# Patient Record
Sex: Female | Born: 1995 | Hispanic: No | Marital: Single | Smoking: Never smoker
Health system: Southern US, Community
[De-identification: ages and names within clinical notes are randomized; demographics above are authoritative.]

## PROBLEM LIST (undated history)

## (undated) DIAGNOSIS — F419 Anxiety disorder, unspecified: Secondary | ICD-10-CM

## (undated) DIAGNOSIS — I10 Essential (primary) hypertension: Secondary | ICD-10-CM

## (undated) DIAGNOSIS — K219 Gastro-esophageal reflux disease without esophagitis: Secondary | ICD-10-CM

## (undated) DIAGNOSIS — E119 Type 2 diabetes mellitus without complications: Secondary | ICD-10-CM

## (undated) DIAGNOSIS — F32A Depression, unspecified: Secondary | ICD-10-CM

## (undated) DIAGNOSIS — E282 Polycystic ovarian syndrome: Secondary | ICD-10-CM

## (undated) HISTORY — DX: Depression, unspecified: F32.A

## (undated) HISTORY — DX: Gastro-esophageal reflux disease without esophagitis: K21.9

## (undated) HISTORY — DX: Anxiety disorder, unspecified: F41.9

## (undated) HISTORY — PX: TONSILLECTOMY: SUR1361

## (undated) HISTORY — DX: Type 2 diabetes mellitus without complications: E11.9

---
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
## 2017-08-29 ENCOUNTER — Telehealth: Payer: Self-pay | Admitting: Internal Medicine

## 2017-08-29 ENCOUNTER — Encounter: Payer: Self-pay | Admitting: Emergency Medicine

## 2017-08-29 ENCOUNTER — Ambulatory Visit: Payer: BC Managed Care – PPO | Admitting: Emergency Medicine

## 2017-08-29 VITALS — BP 135/84 | HR 94 | Temp 98.5°F | Resp 17 | Ht 66.0 in | Wt 333.0 lb

## 2017-08-29 DIAGNOSIS — T24202A Burn of second degree of unspecified site of left lower limb, except ankle and foot, initial encounter: Secondary | ICD-10-CM

## 2017-08-29 MED ORDER — SILVER SULFADIAZINE 1 % EX CREA: 1.0000 "application " | TOPICAL_CREAM | Freq: Every day | CUTANEOUS | 0 refills | Status: AC

## 2017-08-29 MED ORDER — SILVER SULFADIAZINE 1 % EX CREA
TOPICAL_CREAM | Freq: Once | CUTANEOUS | Status: AC
  Administered 2017-08-29: 17:00:00 via TOPICAL

## 2017-08-29 MED ORDER — CEPHALEXIN 500 MG PO CAPS: 500.0000 mg | ORAL_CAPSULE | Freq: Three times a day (TID) | ORAL | 0 refills | Status: AC

## 2017-08-29 NOTE — Telephone Encounter (Signed)
Appointment scheduled.

## 2017-08-29 NOTE — Telephone Encounter (Signed)
Yes, I will see her 

## 2017-08-29 NOTE — Telephone Encounter (Signed)
Copied from CRM [phone]. Topic: Appointment Scheduling - Scheduling Inquiry for Clinic >> Aug 28, 2017  6:20 PM Stephannie LiSimmons, Janett L, [place] wrote: Reason for CRM: Patients Mom and Dad are patients of Dr Yetta BarreJones and they would like for him to become their daughters pcp the parents are Morton AmySandra Oyola and the dad Merlene MorseFrancis Cephus please call the mom art [phone] to let her know and to schedule an appointment .
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
## 2017-08-29 NOTE — Telephone Encounter (Signed)
Dr Yetta BarreJones, would you be willing to see this patient to establish care? Her mother [name](Sandra Oyola) and father Merlene Morse(Francis Telleria) are patients of yours.

## 2017-08-29 NOTE — Patient Instructions (Addendum)
   IF you received an x-ray today, you will receive an invoice from [facility]. Please contact [facility] at [phone] with questions or concerns regarding your invoice.   IF you received labwork today, you will receive an invoice from [facility]. Please contact [facility] at [phone] with questions or concerns regarding your invoice.   Our billing staff will not be able to assist you with questions regarding bills from these companies.  You will be contacted with the lab results as soon as they are available. The fastest way to get your results is to activate your My Chart account. Instructions are located on the last page of this paperwork. If you have not heard from us regarding the results in 2 weeks, please contact this office.     Burn Care, Adult A burn is an injury to the skin or the tissues under the skin. There are three types of burns:  First degree. These burns may cause the skin to be red and a bit swollen.  Second degree. These burns are very painful and cause the skin to be very red. The skin may also leak fluid, look shiny, and start to have blisters.  Third degree. These burns cause permanent damage. They turn the skin white or black and make it look charred, dry, and leathery.  Taking care of your burn properly can help to prevent pain and infection. It can also help the burn to heal more quickly. How is this treated? Right after a burn:  Rinse or soak the burn under cool water. Do this for several minutes. Do not put ice on your burn. That can cause more damage.  Lightly cover the burn with a clean (sterile) cloth (dressing). Burn care  Raise (elevate) the injured area above the level of your heart while sitting or lying down.  Follow instructions from your doctor about: ? How to clean and take care of the burn. ? When to change and remove the cloth.  Check your burn every day for signs of infection. Check for: ? More redness,  swelling, or pain. ? Warmth. ? Pus or a bad smell. Medicine   Take over-the-counter and prescription medicines only as told by your doctor.  If you were prescribed antibiotic medicine, take or apply it as told by your doctor. Do not stop using the antibiotic even if your condition improves. General instructions  To prevent infection: ? Do not put butter, oil, or other home treatments on the burn. ? Do not scratch or pick at the burn. ? Do not break any blisters. ? Do not peel skin.  Do not rub your burn, even when you are cleaning it.  Protect your burn from the sun. Contact a doctor if:  Your condition does not get better.  Your condition gets worse.  You have a fever.  Your burn looks different or starts to have black or red spots on it.  Your burn feels warm to the touch.  Your pain is not controlled with medicine. Get help right away if:  You have redness, swelling, or pain at the site of the burn.  You have fluid, blood, or pus coming from your burn.  You have red streaks near the burn.  You have very bad pain. This information is not intended to replace advice given to you by your health care provider. Make sure you discuss any questions you have with your health care provider. Document Released: 03/22/2008 Document Revised: 07/30/2016 Document Reviewed: 12/01/2015 Elsevier Interactive   Patient Education  2018 Elsevier Inc.  

## 2017-09-05 ENCOUNTER — Encounter: Payer: Self-pay | Admitting: Emergency Medicine

## 2017-09-05 ENCOUNTER — Ambulatory Visit: Payer: BC Managed Care – PPO | Admitting: Emergency Medicine

## 2017-09-05 ENCOUNTER — Other Ambulatory Visit: Payer: Self-pay

## 2017-09-05 VITALS — BP 135/84 | HR 98 | Temp 99.0°F | Resp 16 | Wt 333.2 lb

## 2017-09-05 DIAGNOSIS — T24202D Burn of second degree of unspecified site of left lower limb, except ankle and foot, subsequent encounter: Secondary | ICD-10-CM | POA: Diagnosis not present
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
## 2017-09-05 NOTE — Assessment & Plan Note (Signed)
Much improved.  No signs of infection.  Compliant with medications.  No change in management.  Continue same care.  Follow-up in 1 week.

## 2017-09-05 NOTE — Patient Instructions (Addendum)
   IF you received an x-ray today, you will receive an invoice from [facility]. Please contact [facility] at [phone] with questions or concerns regarding your invoice.   IF you received labwork today, you will receive an invoice from [facility]. Please contact [facility] at [phone] with questions or concerns regarding your invoice.   Our billing staff will not be able to assist you with questions regarding bills from these companies.  You will be contacted with the lab results as soon as they are available. The fastest way to get your results is to activate your My Chart account. Instructions are located on the last page of this paperwork. If you have not heard from us regarding the results in 2 weeks, please contact this office.     Burn Care, Adult A burn is an injury to the skin or the tissues under the skin. There are three types of burns:  First degree. These burns may cause the skin to be red and a bit swollen.  Second degree. These burns are very painful and cause the skin to be very red. The skin may also leak fluid, look shiny, and start to have blisters.  Third degree. These burns cause permanent damage. They turn the skin white or black and make it look charred, dry, and leathery.  Taking care of your burn properly can help to prevent pain and infection. It can also help the burn to heal more quickly. How is this treated? Right after a burn:  Rinse or soak the burn under cool water. Do this for several minutes. Do not put ice on your burn. That can cause more damage.  Lightly cover the burn with a clean (sterile) cloth (dressing). Burn care  Raise (elevate) the injured area above the level of your heart while sitting or lying down.  Follow instructions from your doctor about: ? How to clean and take care of the burn. ? When to change and remove the cloth.  Check your burn every day for signs of infection. Check for: ? More redness,  swelling, or pain. ? Warmth. ? Pus or a bad smell. Medicine   Take over-the-counter and prescription medicines only as told by your doctor.  If you were prescribed antibiotic medicine, take or apply it as told by your doctor. Do not stop using the antibiotic even if your condition improves. General instructions  To prevent infection: ? Do not put butter, oil, or other home treatments on the burn. ? Do not scratch or pick at the burn. ? Do not break any blisters. ? Do not peel skin.  Do not rub your burn, even when you are cleaning it.  Protect your burn from the sun. Contact a doctor if:  Your condition does not get better.  Your condition gets worse.  You have a fever.  Your burn looks different or starts to have black or red spots on it.  Your burn feels warm to the touch.  Your pain is not controlled with medicine. Get help right away if:  You have redness, swelling, or pain at the site of the burn.  You have fluid, blood, or pus coming from your burn.  You have red streaks near the burn.  You have very bad pain. This information is not intended to replace advice given to you by your health care provider. Make sure you discuss any questions you have with your health care provider. Document Released: 03/22/2008 Document Revised: 07/30/2016 Document Reviewed: 12/01/2015 Elsevier Interactive   Patient Education  2018 Elsevier Inc.  

## 2017-09-12 ENCOUNTER — Ambulatory Visit: Payer: BC Managed Care – PPO | Admitting: Emergency Medicine

## 2017-09-12 ENCOUNTER — Encounter: Payer: Self-pay | Admitting: Emergency Medicine

## 2017-09-12 ENCOUNTER — Encounter: Payer: Self-pay | Admitting: Internal Medicine

## 2017-09-12 VITALS — BP 144/82 | HR 119 | Temp 98.8°F | Resp 17 | Ht 66.0 in | Wt 338.0 lb

## 2017-09-12 DIAGNOSIS — T24202D Burn of second degree of unspecified site of left lower limb, except ankle and foot, subsequent encounter: Secondary | ICD-10-CM
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
## 2017-09-12 NOTE — Patient Instructions (Addendum)
   IF you received an x-ray today, you will receive an invoice from [facility]. Please contact [facility] at [phone] with questions or concerns regarding your invoice.   IF you received labwork today, you will receive an invoice from [facility]. Please contact [facility] at [phone] with questions or concerns regarding your invoice.   Our billing staff will not be able to assist you with questions regarding bills from these companies.  You will be contacted with the lab results as soon as they are available. The fastest way to get your results is to activate your My Chart account. Instructions are located on the last page of this paperwork. If you have not heard from us regarding the results in 2 weeks, please contact this office.     Burn Care, Adult A burn is an injury to the skin or the tissues under the skin. There are three types of burns:  First degree. These burns may cause the skin to be red and a bit swollen.  Second degree. These burns are very painful and cause the skin to be very red. The skin may also leak fluid, look shiny, and start to have blisters.  Third degree. These burns cause permanent damage. They turn the skin white or black and make it look charred, dry, and leathery.  Taking care of your burn properly can help to prevent pain and infection. It can also help the burn to heal more quickly. How is this treated? Right after a burn:  Rinse or soak the burn under cool water. Do this for several minutes. Do not put ice on your burn. That can cause more damage.  Lightly cover the burn with a clean (sterile) cloth (dressing). Burn care  Raise (elevate) the injured area above the level of your heart while sitting or lying down.  Follow instructions from your doctor about: ? How to clean and take care of the burn. ? When to change and remove the cloth.  Check your burn every day for signs of infection. Check for: ? More redness,  swelling, or pain. ? Warmth. ? Pus or a bad smell. Medicine   Take over-the-counter and prescription medicines only as told by your doctor.  If you were prescribed antibiotic medicine, take or apply it as told by your doctor. Do not stop using the antibiotic even if your condition improves. General instructions  To prevent infection: ? Do not put butter, oil, or other home treatments on the burn. ? Do not scratch or pick at the burn. ? Do not break any blisters. ? Do not peel skin.  Do not rub your burn, even when you are cleaning it.  Protect your burn from the sun. Contact a doctor if:  Your condition does not get better.  Your condition gets worse.  You have a fever.  Your burn looks different or starts to have black or red spots on it.  Your burn feels warm to the touch.  Your pain is not controlled with medicine. Get help right away if:  You have redness, swelling, or pain at the site of the burn.  You have fluid, blood, or pus coming from your burn.  You have red streaks near the burn.  You have very bad pain. This information is not intended to replace advice given to you by your health care provider. Make sure you discuss any questions you have with your health care provider. Document Released: 03/22/2008 Document Revised: 07/30/2016 Document Reviewed: 12/01/2015 Elsevier Interactive   Patient Education  2018 Elsevier Inc.  

## 2017-09-13 ENCOUNTER — Ambulatory Visit: Payer: BC Managed Care – PPO | Admitting: Internal Medicine

## 2017-09-27 ENCOUNTER — Ambulatory Visit: Payer: BC Managed Care – PPO | Admitting: Internal Medicine

## 2017-10-18 ENCOUNTER — Ambulatory Visit: Payer: BC Managed Care – PPO | Admitting: Internal Medicine

## 2017-10-18 ENCOUNTER — Encounter: Payer: Self-pay | Admitting: Internal Medicine

## 2017-10-18 ENCOUNTER — Other Ambulatory Visit (INDEPENDENT_AMBULATORY_CARE_PROVIDER_SITE_OTHER): Payer: BC Managed Care – PPO

## 2017-10-18 VITALS — BP 124/84 | HR 116 | Temp 99.5°F | Ht 66.0 in | Wt 344.0 lb

## 2017-10-18 DIAGNOSIS — Z0000 Encounter for general adult medical examination without abnormal findings: Secondary | ICD-10-CM | POA: Diagnosis not present

## 2017-10-18 DIAGNOSIS — Z114 Encounter for screening for human immunodeficiency virus [HIV]: Secondary | ICD-10-CM

## 2017-10-18 DIAGNOSIS — E282 Polycystic ovarian syndrome: Secondary | ICD-10-CM

## 2017-10-18 LAB — CBC WITH DIFFERENTIAL/PLATELET
BASOS ABS: 0.1 10*3/uL (ref 0.0–0.1)
Basophils Relative: 0.7 % (ref 0.0–3.0)
Eosinophils Absolute: 0.2 10*3/uL (ref 0.0–0.7)
Eosinophils Relative: 1.2 % (ref 0.0–5.0)
HCT: 40.9 % (ref 36.0–49.0)
Hemoglobin: 13.6 g/dL (ref 12.0–16.0)
LYMPHS ABS: 3.3 10*3/uL (ref 0.7–4.0)
Lymphocytes Relative: 23.8 % — ABNORMAL LOW (ref 24.0–48.0)
MCHC: 33.3 g/dL (ref 31.0–37.0)
MCV: 83.3 fl (ref 78.0–98.0)
MONO ABS: 1.1 10*3/uL — AB (ref 0.1–1.0)
Monocytes Relative: 8.2 % (ref 3.0–12.0)
NEUTROS ABS: 9.2 10*3/uL — AB (ref 1.4–7.7)
NEUTROS PCT: 66.1 % (ref 43.0–71.0)
RBC: 4.9 Mil/uL (ref 3.80–5.70)
RDW: 13.6 % (ref 11.4–15.5)
WBC: 13.8 10*3/uL — ABNORMAL HIGH (ref 4.5–13.5)

## 2017-10-18 LAB — BASIC METABOLIC PANEL
BUN: 9 mg/dL (ref 6–23)
CALCIUM: 9.7 mg/dL (ref 8.4–10.5)
CHLORIDE: 105 meq/L (ref 96–112)
CO2: 28 meq/L (ref 19–32)
CREATININE: 0.77 mg/dL (ref 0.40–1.20)
GFR: 102.03 mL/min (ref >60.00)
GLUCOSE: 87 mg/dL (ref 70–99)
Potassium: 4.4 mEq/L (ref 3.5–5.1)
SODIUM: 141 meq/L (ref 135–145)

## 2017-10-18 LAB — HEPATIC FUNCTION PANEL
ALBUMIN: 4.2 g/dL (ref 3.5–5.2)
ALK PHOS: 75 U/L (ref 47–119)
ALT: 21 U/L (ref 0–35)
AST: 20 U/L (ref 0–37)
BILIRUBIN TOTAL: 0.3 mg/dL (ref 0.2–1.2)
Bilirubin, Direct: 0 mg/dL (ref 0.0–0.3)
Total Protein: 8 g/dL (ref 6.0–8.3)

## 2017-10-18 LAB — URINALYSIS, ROUTINE W REFLEX MICROSCOPIC
Bilirubin Urine: NEGATIVE
HGB URINE DIPSTICK: NEGATIVE
LEUKOCYTES UA: NEGATIVE
Nitrite: NEGATIVE
RBC / HPF: NONE SEEN (ref 0–?)
Specific Gravity, Urine: >=1.03 — AB (ref 1.000–1.030)
TOTAL PROTEIN, URINE-UPE24: NEGATIVE
URINE GLUCOSE: NEGATIVE
Urobilinogen, UA: 0.2 (ref 0.0–1.0)
pH: 6 (ref 5.0–8.0)

## 2017-10-18 LAB — LIPID PANEL
CHOL/HDL RATIO: 4
Cholesterol: 177 mg/dL (ref 0–200)
HDL: 49.1 mg/dL (ref >39.00)
LDL CALC: 106 mg/dL — AB (ref 0–99)
NONHDL: 128.05
TRIGLYCERIDES: 108 mg/dL (ref 0.0–149.0)
VLDL: 21.6 mg/dL (ref 0.0–40.0)

## 2017-10-18 LAB — TSH: TSH: 1.54 u[IU]/mL (ref 0.40–5.00)

## 2017-10-18 NOTE — Assessment & Plan Note (Signed)
Ok for GYN and endo referrals,  to f/u any worsening symptoms or concerns

## 2017-10-18 NOTE — Assessment & Plan Note (Signed)
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
## 2017-10-18 NOTE — Patient Instructions (Signed)
Please continue all other medications as before, and refills have been done if requested.  Please have the pharmacy call with any other refills you may need.  Please continue your efforts at being more active, low cholesterol diet, and weight control.  You are otherwise up to date with prevention measures today.  Please keep your appointments with your specialists as you may have planned  You will be contacted regarding the referral for: GYN and Endocrinology  Please go to the LAB in the Basement (turn left off the elevator) for the tests to be done today  You will be contacted by phone if any changes need to be made immediately.  Otherwise, you will receive a letter about your results with an explanation, but please check with MyChart first.  Please remember to sign up for MyChart if you have not done so, as this will be important to you in the future with finding out test results, communicating by private email, and scheduling acute appointments online when needed.  Please return in 1 year for your yearly visit, or sooner if needed, with Lab testing done 3-5 days before

## 2017-10-19 LAB — HIV ANTIBODY (ROUTINE TESTING W REFLEX): HIV: NONREACTIVE

## 2017-12-25 ENCOUNTER — Encounter: Payer: Self-pay | Admitting: Internal Medicine

## 2018-01-04 ENCOUNTER — Encounter (HOSPITAL_COMMUNITY): Payer: Self-pay | Admitting: Emergency Medicine

## 2018-01-04 ENCOUNTER — Ambulatory Visit: Payer: Self-pay | Admitting: *Deleted

## 2018-01-04 ENCOUNTER — Ambulatory Visit (HOSPITAL_COMMUNITY)
Admission: EM | Admit: 2018-01-04 | Discharge: 2018-01-04 | Disposition: A | Discharge status: Home / Self Care | Payer: BC Managed Care – PPO | Attending: Family Medicine | Admitting: Family Medicine

## 2018-01-04 DIAGNOSIS — R0789 Other chest pain: Secondary | ICD-10-CM

## 2018-01-04 DIAGNOSIS — R030 Elevated blood-pressure reading, without diagnosis of hypertension: Secondary | ICD-10-CM

## 2018-01-04 DIAGNOSIS — R079 Chest pain, unspecified: Secondary | ICD-10-CM

## 2018-01-04 MED ORDER — GI COCKTAIL ~~LOC~~
ORAL | Status: AC
  Filled 2018-01-04: qty 30

## 2018-01-04 MED ORDER — GI COCKTAIL ~~LOC~~
30.0000 mL | Freq: Once | ORAL | Status: AC
Start: 2018-01-04 — End: 2018-01-04
  Administered 2018-01-04: 30 mL via ORAL

## 2018-01-04 MED ORDER — ACETAMINOPHEN 500 MG PO TABS: 500.0000 mg | ORAL_TABLET | Freq: Four times a day (QID) | ORAL | 0 refills | Status: DC | PRN

## 2018-01-04 MED ORDER — OMEPRAZOLE 20 MG PO CPDR: 20.0000 mg | DELAYED_RELEASE_CAPSULE | Freq: Every day | ORAL | 0 refills | Status: DC

## 2018-01-04 NOTE — Telephone Encounter (Signed)
Pt reports at dentist office for cleaning; BP 160/117. Rechecked x 2...159/110 and 157/105. Wrist monitor used. No medications. Reports "History of high BP in 2016."  Reports mild headache 3/10, chest "A little tight." Pt is presently at a [employer]. Instructed pt to recheck BP; NT will call back. 1305:  NT called pt back. States BP 169/123   HR 107.  Chest tightness presently.Directed pt to ED. States will follow disposition, mother to drive.  Reason for Disposition . [1] Systolic BP  >= 180 OR Diastolic >= 110 AND [2] missed most recent dose of blood pressure medication    Not on medication. B/P checked at dentist office; wrist monitor.  Answer Assessment - Initial Assessment Questions 1. BLOOD PRESSURE: "What is the blood pressure?" "Did you take at least two measurements 5 minutes apart?"     At dentist: 160/117     159/110       157/105 2. ONSET: "When did you take your blood pressure?"     This afternoon, 15 minutes ago 3. HOW: "How did you obtain the blood pressure?" (e.g., visiting nurse, automatic home BP monitor)     Wrist, at dentist 4. HISTORY: "Do you have a history of high blood pressure?"     States yes, 2016 5. MEDICATIONS: "Are you taking any medications for blood pressure?" "Have you missed any doses recently?"     n/a 6. OTHER SYMPTOMS: "Do you have any symptoms?" (e.g., headache, chest pain, blurred vision, difficulty breathing, weakness)    Headache, 3/10 ...chest "A little tight"  Protocols used: HIGH BLOOD PRESSURE-A-AH
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
## 2018-01-04 NOTE — Discharge Instructions (Addendum)
Omeprazole daily. Tylenol as needed for headache.  Please follow up with your primary care doctor for recheck of your blood pressure and symptoms. If you develop worsening of pain, fevers, shortness of breath , nausea, or otherwise worsening please return or go to Er.

## 2018-01-04 NOTE — ED Triage Notes (Signed)
Pt states she was at the dentist and they took her BP and it was elevated, 150/110.

## 2018-01-06 ENCOUNTER — Encounter: Payer: Self-pay | Admitting: Internal Medicine

## 2018-01-10 ENCOUNTER — Ambulatory Visit: Payer: BC Managed Care – PPO | Admitting: Family

## 2018-01-10 ENCOUNTER — Ambulatory Visit (INDEPENDENT_AMBULATORY_CARE_PROVIDER_SITE_OTHER)
Admission: RE | Admit: 2018-01-10 | Discharge: 2018-01-10 | Disposition: A | Discharge status: Home / Self Care | Payer: BC Managed Care – PPO | Source: Ambulatory Visit | Attending: Family | Admitting: Family

## 2018-01-10 ENCOUNTER — Encounter: Payer: Self-pay | Admitting: Family

## 2018-01-10 ENCOUNTER — Other Ambulatory Visit (INDEPENDENT_AMBULATORY_CARE_PROVIDER_SITE_OTHER): Payer: BC Managed Care – PPO

## 2018-01-10 VITALS — BP 158/88 | HR 98 | Temp 97.9°F | Ht 66.01 in | Wt 357.2 lb

## 2018-01-10 DIAGNOSIS — R109 Unspecified abdominal pain: Secondary | ICD-10-CM | POA: Diagnosis not present

## 2018-01-10 DIAGNOSIS — R600 Localized edema: Secondary | ICD-10-CM | POA: Diagnosis not present

## 2018-01-10 DIAGNOSIS — I10 Essential (primary) hypertension: Secondary | ICD-10-CM | POA: Diagnosis not present

## 2018-01-10 LAB — CBC WITH DIFFERENTIAL/PLATELET
BASOS PCT: 0.3 % (ref 0.0–3.0)
Basophils Absolute: 0 10*3/uL (ref 0.0–0.1)
EOS PCT: 6.8 % — AB (ref 0.0–5.0)
Eosinophils Absolute: 0.7 10*3/uL (ref 0.0–0.7)
HCT: 38.7 % (ref 36.0–49.0)
Hemoglobin: 12.8 g/dL (ref 12.0–16.0)
Lymphocytes Relative: 27.9 % (ref 24.0–48.0)
Lymphs Abs: 2.8 10*3/uL (ref 0.7–4.0)
MCHC: 33.1 g/dL (ref 31.0–37.0)
MCV: 82.5 fl (ref 78.0–98.0)
MONO ABS: 0.9 10*3/uL (ref 0.1–1.0)
Monocytes Relative: 8.8 % (ref 3.0–12.0)
NEUTROS PCT: 56.2 % (ref 43.0–71.0)
Neutro Abs: 5.7 10*3/uL (ref 1.4–7.7)
PLATELETS: 385 10*3/uL (ref 150.0–575.0)
RBC: 4.69 Mil/uL (ref 3.80–5.70)
RDW: 14.3 % (ref 11.4–15.5)
WBC: 10.1 10*3/uL (ref 4.5–13.5)

## 2018-01-10 LAB — COMPREHENSIVE METABOLIC PANEL
ALK PHOS: 66 U/L (ref 47–119)
ALT: 20 U/L (ref 0–35)
AST: 12 U/L (ref 0–37)
Albumin: 4.1 g/dL (ref 3.5–5.2)
BUN: 12 mg/dL (ref 6–23)
CO2: 30 mEq/L (ref 19–32)
CREATININE: 0.74 mg/dL (ref 0.40–1.20)
Calcium: 9.6 mg/dL (ref 8.4–10.5)
Chloride: 103 mEq/L (ref 96–112)
GFR: 106.57 mL/min (ref >60.00)
Glucose, Bld: 97 mg/dL (ref 70–99)
POTASSIUM: 4.4 meq/L (ref 3.5–5.1)
Sodium: 140 mEq/L (ref 135–145)
TOTAL PROTEIN: 7.9 g/dL (ref 6.0–8.3)
Total Bilirubin: 0.2 mg/dL (ref 0.2–1.2)

## 2018-01-10 LAB — URINALYSIS
Bilirubin Urine: NEGATIVE
HGB URINE DIPSTICK: NEGATIVE
Ketones, ur: NEGATIVE
Leukocytes, UA: NEGATIVE
NITRITE: NEGATIVE
Specific Gravity, Urine: >=1.03 — AB (ref 1.000–1.030)
Total Protein, Urine: NEGATIVE
URINE GLUCOSE: NEGATIVE
Urobilinogen, UA: 0.2 (ref 0.0–1.0)
pH: 5.5 (ref 5.0–8.0)

## 2018-01-10 LAB — BRAIN NATRIURETIC PEPTIDE: Pro B Natriuretic peptide (BNP): 12 pg/mL (ref 0.0–100.0)

## 2018-01-10 MED ORDER — HYDROCHLOROTHIAZIDE 25 MG PO TABS: 25.0000 mg | ORAL_TABLET | Freq: Every day | ORAL | 1 refills | Status: DC
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
## 2018-01-10 NOTE — Patient Instructions (Signed)
DASH Eating Plan DASH stands for "Dietary Approaches to Stop Hypertension." The DASH eating plan is a healthy eating plan that has been shown to reduce high blood pressure (hypertension). It may also reduce your risk for type 2 diabetes, heart disease, and stroke. The DASH eating plan may also help with weight loss. What are tips for following this plan? General guidelines  Avoid eating more than 2,300 mg (milligrams) of salt (sodium) a day. If you have hypertension, you may need to reduce your sodium intake to 1,500 mg a day.  Limit alcohol intake to no more than 1 drink a day for nonpregnant women and 2 drinks a day for men. One drink equals 12 oz of beer, 5 oz of wine, or 1 oz of hard liquor.  Work with your health care provider to maintain a healthy body weight or to lose weight. Ask what an ideal weight is for you.  Get at least 30 minutes of exercise that causes your heart to beat faster (aerobic exercise) most days of the week. Activities may include walking, swimming, or biking.  Work with your health care provider or diet and nutrition specialist (dietitian) to adjust your eating plan to your individual calorie needs. Reading food labels  Check food labels for the amount of sodium per serving. Choose foods with less than 5 percent of the Daily Value of sodium. Generally, foods with less than 300 mg of sodium per serving fit into this eating plan.  To find whole grains, look for the word "whole" as the first word in the ingredient list. Shopping  Buy products labeled as "low-sodium" or "no salt added."  Buy fresh foods. Avoid canned foods and premade or frozen meals. Cooking  Avoid adding salt when cooking. Use salt-free seasonings or herbs instead of table salt or sea salt. Check with your health care provider or pharmacist before using salt substitutes.  Do not fry foods. Cook foods using healthy methods such as baking, boiling, grilling, and broiling instead.  Cook with  heart-healthy oils, such as olive, canola, soybean, or sunflower oil. Meal planning   Eat a balanced diet that includes: ? 5 or more servings of fruits and vegetables each day. At each meal, try to fill half of your plate with fruits and vegetables. ? Up to 6-8 servings of whole grains each day. ? Less than 6 oz of lean meat, poultry, or fish each day. A 3-oz serving of meat is about the same size as a deck of cards. One egg equals 1 oz. ? 2 servings of low-fat dairy each day. ? A serving of nuts, seeds, or beans 5 times each week. ? Heart-healthy fats. Healthy fats called Omega-3 fatty acids are found in foods such as flaxseeds and coldwater fish, like sardines, salmon, and mackerel.  Limit how much you eat of the following: ? Canned or prepackaged foods. ? Food that is high in trans fat, such as fried foods. ? Food that is high in saturated fat, such as fatty meat. ? Sweets, desserts, sugary drinks, and other foods with added sugar. ? Full-fat dairy products.  Do not salt foods before eating.  Try to eat at least 2 vegetarian meals each week.  Eat more home-cooked food and less restaurant, buffet, and fast food.  When eating at a restaurant, ask that your food be prepared with less salt or no salt, if possible. What foods are recommended? The items listed may not be a complete list. Talk with your dietitian about what   dietary choices are best for you. Grains Whole-grain or whole-wheat bread. Whole-grain or whole-wheat pasta. Brown rice. Oatmeal. Quinoa. Bulgur. Whole-grain and low-sodium cereals. Pita bread. Low-fat, low-sodium crackers. Whole-wheat flour tortillas. Vegetables Fresh or frozen vegetables (raw, steamed, roasted, or grilled). Low-sodium or reduced-sodium tomato and vegetable juice. Low-sodium or reduced-sodium tomato sauce and tomato paste. Low-sodium or reduced-sodium canned vegetables. Fruits All fresh, dried, or frozen fruit. Canned fruit in natural juice (without  added sugar). Meat and other protein foods Skinless chicken or turkey. Ground chicken or turkey. Pork with fat trimmed off. Fish and seafood. Egg whites. Dried beans, peas, or lentils. Unsalted nuts, nut butters, and seeds. Unsalted canned beans. Lean cuts of beef with fat trimmed off. Low-sodium, lean deli meat. Dairy Low-fat (1%) or fat-free (skim) milk. Fat-free, low-fat, or reduced-fat cheeses. Nonfat, low-sodium ricotta or cottage cheese. Low-fat or nonfat yogurt. Low-fat, low-sodium cheese. Fats and oils Soft margarine without trans fats. Vegetable oil. Low-fat, reduced-fat, or light mayonnaise and salad dressings (reduced-sodium). Canola, safflower, olive, soybean, and sunflower oils. Avocado. Seasoning and other foods Herbs. Spices. Seasoning mixes without salt. Unsalted popcorn and pretzels. Fat-free sweets. What foods are not recommended? The items listed may not be a complete list. Talk with your dietitian about what dietary choices are best for you. Grains Baked goods made with fat, such as croissants, muffins, or some breads. Dry pasta or rice meal packs. Vegetables Creamed or fried vegetables. Vegetables in a cheese sauce. Regular canned vegetables (not low-sodium or reduced-sodium). Regular canned tomato sauce and paste (not low-sodium or reduced-sodium). Regular tomato and vegetable juice (not low-sodium or reduced-sodium). Pickles. Olives. Fruits Canned fruit in a light or heavy syrup. Fried fruit. Fruit in cream or butter sauce. Meat and other protein foods Fatty cuts of meat. Ribs. Fried meat. Bacon. Sausage. Bologna and other processed lunch meats. Salami. Fatback. Hotdogs. Bratwurst. Salted nuts and seeds. Canned beans with added salt. Canned or smoked fish. Whole eggs or egg yolks. Chicken or turkey with skin. Dairy Whole or 2% milk, cream, and half-and-half. Whole or full-fat cream cheese. Whole-fat or sweetened yogurt. Full-fat cheese. Nondairy creamers. Whipped toppings.  Processed cheese and cheese spreads. Fats and oils Butter. Stick margarine. Lard. Shortening. Ghee. Bacon fat. Tropical oils, such as coconut, palm kernel, or palm oil. Seasoning and other foods Salted popcorn and pretzels. Onion salt, garlic salt, seasoned salt, table salt, and sea salt. Worcestershire sauce. Tartar sauce. Barbecue sauce. Teriyaki sauce. Soy sauce, including reduced-sodium. Steak sauce. Canned and packaged gravies. Fish sauce. Oyster sauce. Cocktail sauce. Horseradish that you find on the shelf. Ketchup. Mustard. Meat flavorings and tenderizers. Bouillon cubes. Hot sauce and Tabasco sauce. Premade or packaged marinades. Premade or packaged taco seasonings. Relishes. Regular salad dressings. Where to find more information:  National Heart, Lung, and Blood Institute: www.nhlbi.nih.gov  American Heart Association: www.heart.org Summary  The DASH eating plan is a healthy eating plan that has been shown to reduce high blood pressure (hypertension). It may also reduce your risk for type 2 diabetes, heart disease, and stroke.  With the DASH eating plan, you should limit salt (sodium) intake to 2,300 mg a day. If you have hypertension, you may need to reduce your sodium intake to 1,500 mg a day.  When on the DASH eating plan, aim to eat more fresh fruits and vegetables, whole grains, lean proteins, low-fat dairy, and heart-healthy fats.  Work with your health care provider or diet and nutrition specialist (dietitian) to adjust your eating plan to your individual   calorie needs. This information is not intended to replace advice given to you by your health care provider. Make sure you discuss any questions you have with your health care provider. Document Released: 06/02/2011 Document Revised: 06/06/2016 Document Reviewed: 06/06/2016 Elsevier Interactive Patient Education  2018 [employer] Inc.  

## 2018-01-17 ENCOUNTER — Ambulatory Visit (HOSPITAL_COMMUNITY)
Admission: RE | Admit: 2018-01-17 | Discharge: 2018-01-17 | Disposition: A | Discharge status: Home / Self Care | Payer: BC Managed Care – PPO | Source: Ambulatory Visit | Attending: Cardiology | Admitting: Cardiology

## 2018-01-17 DIAGNOSIS — I10 Essential (primary) hypertension: Secondary | ICD-10-CM

## 2018-01-19 ENCOUNTER — Encounter: Payer: Self-pay | Admitting: Family

## 2018-01-19 ENCOUNTER — Ambulatory Visit: Payer: BC Managed Care – PPO | Admitting: Family

## 2018-01-19 ENCOUNTER — Ambulatory Visit: Payer: BC Managed Care – PPO | Admitting: Internal Medicine

## 2018-01-19 ENCOUNTER — Ambulatory Visit (INDEPENDENT_AMBULATORY_CARE_PROVIDER_SITE_OTHER)
Admission: RE | Admit: 2018-01-19 | Discharge: 2018-01-19 | Disposition: A | Discharge status: Home / Self Care | Payer: BC Managed Care – PPO | Source: Ambulatory Visit | Attending: Family | Admitting: Family

## 2018-01-19 VITALS — BP 138/90 | HR 97 | Temp 98.6°F | Ht 66.01 in | Wt 348.0 lb

## 2018-01-19 DIAGNOSIS — M25562 Pain in left knee: Secondary | ICD-10-CM

## 2018-01-19 DIAGNOSIS — I10 Essential (primary) hypertension: Secondary | ICD-10-CM | POA: Diagnosis not present

## 2018-01-19 MED ORDER — HYDROCHLOROTHIAZIDE 25 MG PO TABS: 25.0000 mg | ORAL_TABLET | Freq: Every day | ORAL | 0 refills | Status: DC

## 2018-01-19 MED ORDER — LISINOPRIL 10 MG PO TABS: 10.0000 mg | ORAL_TABLET | Freq: Every day | ORAL | 0 refills | Status: DC
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
## 2018-01-19 NOTE — Telephone Encounter (Signed)
(  FYI) Was their anything else you would advise or stick with what regiment she has now?

## 2018-01-22 ENCOUNTER — Encounter: Payer: Self-pay | Admitting: Family

## 2018-02-05 ENCOUNTER — Ambulatory Visit: Payer: BC Managed Care – PPO | Admitting: Endocrinology

## 2018-06-11 ENCOUNTER — Encounter: Payer: Self-pay | Admitting: Family

## 2018-06-11 ENCOUNTER — Ambulatory Visit: Payer: BC Managed Care – PPO | Admitting: Family

## 2018-06-11 VITALS — BP 130/86 | HR 111 | Temp 98.7°F | Resp 18 | Wt 364.0 lb

## 2018-06-11 DIAGNOSIS — J111 Influenza due to unidentified influenza virus with other respiratory manifestations: Secondary | ICD-10-CM | POA: Diagnosis not present

## 2018-06-11 DIAGNOSIS — R52 Pain, unspecified: Secondary | ICD-10-CM | POA: Diagnosis not present

## 2018-06-11 LAB — POC INFLUENZA A&B (BINAX/QUICKVUE)
INFLUENZA B, POC: POSITIVE — AB
Influenza A, POC: NEGATIVE

## 2018-06-11 MED ORDER — OSELTAMIVIR PHOSPHATE 75 MG PO CAPS: 75.0000 mg | ORAL_CAPSULE | Freq: Two times a day (BID) | ORAL | 0 refills | Status: DC

## 2018-06-11 MED ORDER — HYDROCHLOROTHIAZIDE 25 MG PO TABS: 25.0000 mg | ORAL_TABLET | Freq: Every day | ORAL | 0 refills | Status: DC

## 2018-06-11 MED ORDER — LISINOPRIL 10 MG PO TABS: 10.0000 mg | ORAL_TABLET | Freq: Every day | ORAL | 0 refills | Status: DC

## 2018-06-11 NOTE — Patient Instructions (Signed)
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
## 2018-06-15 ENCOUNTER — Encounter: Payer: Self-pay | Admitting: Family

## 2018-06-16 ENCOUNTER — Encounter: Payer: Self-pay | Admitting: Family

## 2018-08-28 ENCOUNTER — Encounter (HOSPITAL_COMMUNITY): Payer: Self-pay

## 2018-08-28 ENCOUNTER — Emergency Department (HOSPITAL_COMMUNITY): Payer: BC Managed Care – PPO

## 2018-08-28 ENCOUNTER — Encounter: Payer: Self-pay | Admitting: Family

## 2018-08-28 ENCOUNTER — Emergency Department (HOSPITAL_COMMUNITY)
Admission: EM | Admit: 2018-08-28 | Discharge: 2018-08-28 | Disposition: A | Discharge status: Home / Self Care | Payer: BC Managed Care – PPO | Attending: Emergency Medicine | Admitting: Emergency Medicine

## 2018-08-28 ENCOUNTER — Other Ambulatory Visit: Payer: Self-pay

## 2018-08-28 ENCOUNTER — Ambulatory Visit: Payer: Self-pay

## 2018-08-28 DIAGNOSIS — Z7982 Long term (current) use of aspirin: Secondary | ICD-10-CM | POA: Diagnosis not present

## 2018-08-28 DIAGNOSIS — I10 Essential (primary) hypertension: Secondary | ICD-10-CM | POA: Diagnosis not present

## 2018-08-28 DIAGNOSIS — K219 Gastro-esophageal reflux disease without esophagitis: Secondary | ICD-10-CM

## 2018-08-28 DIAGNOSIS — R0789 Other chest pain: Secondary | ICD-10-CM | POA: Diagnosis present

## 2018-08-28 DIAGNOSIS — Z79899 Other long term (current) drug therapy: Secondary | ICD-10-CM | POA: Insufficient documentation

## 2018-08-28 LAB — CBC
HCT: 40.2 % (ref 36.0–46.0)
Hemoglobin: 12.1 g/dL (ref 12.0–15.0)
MCH: 25.8 pg — ABNORMAL LOW (ref 26.0–34.0)
MCHC: 30.1 g/dL (ref 30.0–36.0)
MCV: 85.7 fL (ref 80.0–100.0)
NRBC: 0 % (ref 0.0–0.2)
PLATELETS: 376 10*3/uL (ref 150–400)
RBC: 4.69 MIL/uL (ref 3.87–5.11)
RDW: 13.2 % (ref 11.5–15.5)
WBC: 10.3 10*3/uL (ref 4.0–10.5)

## 2018-08-28 LAB — I-STAT BETA HCG BLOOD, ED (MC, WL, AP ONLY): I-stat hCG, quantitative: <5 m[IU]/mL (ref <5)

## 2018-08-28 LAB — BASIC METABOLIC PANEL
Anion gap: 7 (ref 5–15)
BUN: 14 mg/dL (ref 6–20)
CO2: 25 mmol/L (ref 22–32)
CREATININE: 0.99 mg/dL (ref 0.44–1.00)
Calcium: 8.7 mg/dL — ABNORMAL LOW (ref 8.9–10.3)
Chloride: 106 mmol/L (ref 98–111)
Glucose, Bld: 104 mg/dL — ABNORMAL HIGH (ref 70–99)
Potassium: 3.9 mmol/L (ref 3.5–5.1)
SODIUM: 138 mmol/L (ref 135–145)

## 2018-08-28 LAB — I-STAT TROPONIN, ED
TROPONIN I, POC: 0.01 ng/mL (ref 0.00–0.08)
Troponin i, poc: 0 ng/mL (ref 0.00–0.08)

## 2018-08-28 MED ORDER — KETOROLAC TROMETHAMINE 60 MG/2ML IM SOLN
60.0000 mg | Freq: Once | INTRAMUSCULAR | Status: AC
  Administered 2018-08-28: 60 mg via INTRAMUSCULAR
  Filled 2018-08-28: qty 2

## 2018-08-28 MED ORDER — LIDOCAINE VISCOUS HCL 2 % MT SOLN
15.0000 mL | Freq: Once | OROMUCOSAL | Status: AC
  Administered 2018-08-28: 15 mL via ORAL
  Filled 2018-08-28: qty 15

## 2018-08-28 MED ORDER — ONDANSETRON HCL 4 MG/2ML IJ SOLN: 4.0000 mg | Freq: Once | INTRAMUSCULAR | Status: DC

## 2018-08-28 MED ORDER — SUCRALFATE 1 G PO TABS: 1.0000 g | ORAL_TABLET | Freq: Three times a day (TID) | ORAL | 0 refills | Status: DC

## 2018-08-28 MED ORDER — ALUM & MAG HYDROXIDE-SIMETH 200-200-20 MG/5ML PO SUSP
30.0000 mL | Freq: Once | ORAL | Status: AC
  Administered 2018-08-28: 30 mL via ORAL
  Filled 2018-08-28: qty 30

## 2018-08-28 MED ORDER — PANTOPRAZOLE SODIUM 20 MG PO TBEC: 20.0000 mg | DELAYED_RELEASE_TABLET | Freq: Every day | ORAL | 0 refills | Status: DC

## 2018-08-28 MED ORDER — PANTOPRAZOLE SODIUM 40 MG PO TBEC
40.0000 mg | DELAYED_RELEASE_TABLET | Freq: Once | ORAL | Status: AC
  Administered 2018-08-28: 40 mg via ORAL
  Filled 2018-08-28: qty 1

## 2018-08-28 MED ORDER — ONDANSETRON 4 MG PO TBDP
4.0000 mg | ORAL_TABLET | Freq: Once | ORAL | Status: AC
  Administered 2018-08-28: 4 mg via ORAL
  Filled 2018-08-28: qty 1
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
## 2018-08-28 NOTE — Telephone Encounter (Signed)
Please call her and see what her symptoms are; needs to be seen today if possible.

## 2018-08-28 NOTE — Discharge Instructions (Signed)
Thank you for allowing me to care for you today. Please return to the emergency department if you have new or worsening symptoms. Take your medications as instructed.  ° °

## 2018-08-28 NOTE — Telephone Encounter (Signed)
Called pt and EMS was there to take her to ED.

## 2018-08-28 NOTE — Telephone Encounter (Signed)
Pt c/o pressure to the center of her chest, sweating, right arm tingling, nausea. Chest pressure started at 0500 and symptoms intensified at 1030. Pt rates the chest  pressure a 7-8. Pt with elevated BP 155/84. Advised and offered to call 911 now. Pt stated she will call 911. Care advice given and pt had taken 1 baby aspirin already. Pt advised to take 2 more now and be NPO. Pt verbalized understanding. Reason for Disposition . [1] Chest pain lasts > 5 minutes AND [2] described as crushing, pressure-like, or heavy  Answer Assessment - Initial Assessment Questions 1. LOCATION: "Where does it hurt?"       Center of chest  2. RADIATION: "Does the pain go anywhere else?" (e.g., into neck, jaw, arms, back)     Right arm tingling 3. ONSET: "When did the chest pain begin?" (Minutes, hours or days)      0500 this morning 4. PATTERN "Does the pain come and go, or has it been constant since it started?"  "Does it get worse with exertion?"      constant 5. DURATION: "How long does it last" (e.g., seconds, minutes, hours)     started at 0500 and worsened 6. SEVERITY: "How bad is the pain?"  (e.g., Scale 1-10; mild, moderate, or severe)    - MILD (1-3): doesn't interfere with normal activities     - MODERATE (4-7): interferes with normal activities or awakens from sleep    - SEVERE (8-10): excruciating pain, unable to do any normal activities       Moderate to severe 7. CARDIAC RISK FACTORS: "Do you have any history of heart problems or risk factors for heart disease?" (e.g., prior heart attack, angina; high blood pressure, diabetes, being overweight, high cholesterol, smoking, or strong family history of heart disease)    HTN, overweight, Paternal GM 8. PULMONARY RISK FACTORS: "Do you have any history of lung disease?"  (e.g., blood clots in lung, asthma, emphysema, birth control pills)     H/o BCP 9. CAUSE: "What do you think is causing the chest pain?"     Pt doesn't know 10. OTHER SYMPTOMS: "Do you  have any other symptoms?" (e.g., dizziness, nausea, vomiting, sweating, fever, difficulty breathing, cough)       Nausea, sweating 11. PREGNANCY: "Is there any chance you are pregnant?" "When was your last menstrual period?"       No LMP: 1 month ago  Protocols used: CHEST PAIN-A-AH

## 2018-08-28 NOTE — ED Triage Notes (Signed)
EMS- Pt here for chest pain radiating to her back. Pt reported some nausea as well. 12 lead unremarkable.   106/81 102 98% cbg 115

## 2018-09-05 ENCOUNTER — Other Ambulatory Visit: Payer: Self-pay | Admitting: Family

## 2018-09-05 MED ORDER — LISINOPRIL 10 MG PO TABS: 10.0000 mg | ORAL_TABLET | Freq: Every day | ORAL | 0 refills | Status: DC

## 2018-09-06 ENCOUNTER — Ambulatory Visit: Payer: Self-pay | Admitting: *Deleted

## 2018-09-06 ENCOUNTER — Encounter: Payer: Self-pay | Admitting: Family Medicine

## 2018-09-06 ENCOUNTER — Other Ambulatory Visit: Payer: Self-pay

## 2018-09-06 ENCOUNTER — Telehealth: Payer: Self-pay | Admitting: Family

## 2018-09-06 ENCOUNTER — Ambulatory Visit: Payer: BC Managed Care – PPO | Admitting: Family Medicine

## 2018-09-06 VITALS — BP 130/72 | HR 105 | Temp 98.7°F | Wt 364.8 lb

## 2018-09-06 DIAGNOSIS — R0789 Other chest pain: Secondary | ICD-10-CM | POA: Diagnosis not present

## 2018-09-06 DIAGNOSIS — K219 Gastro-esophageal reflux disease without esophagitis: Secondary | ICD-10-CM | POA: Diagnosis not present

## 2018-09-06 NOTE — Telephone Encounter (Signed)
Is requesting to transfer from Kristen Ellison to [name].  Please advise.

## 2018-09-06 NOTE — Telephone Encounter (Signed)
Pt calling with complaints of a stabbing, sharp and shooting pain that is in the middle of her chest and goes to her back. Pt states she experienced this pain for the past couple of days and was seen for the same issue in the ED on 08/28/18 and diagnosed with GERD. Pt states she has been taking pantoprazole and Carafate daily since being prescribed the medication on 08/28/18. Pt currently rates pain at 7 and states she feels the pain go from her chest into her throat. Pt currently denies having a sour taste in her mouth.Pt scheduled for appt with Cathlyn Parsons today at 3 pm. Pt advised that if symptoms became worse before scheduled appt to seek treatment in the ED. Pt verbalized understanding.  Reason for Disposition . [1] Patient claims chest pain is same as previously diagnosed "heartburn" AND [2] describes burning in chest AND [3] accompanying sour taste in mouth  Answer Assessment - Initial Assessment Questions 1. LOCATION: "Where does it hurt?"      Middle of chest 2. RADIATION: "Does the pain go anywhere else?" (e.g., into neck, jaw, arms, back)     back 3. ONSET: "When did the chest pain begin?" (Minutes, hours or days)      A couple of days now but experienced same symptoms on 08/28/18 4. PATTERN "Does the pain come and go, or has it been constant since it started?"  "Does it get worse with exertion?"      Constant, pt describes the pain as a stabbing, sharp pain 5. DURATION: "How long does it last" (e.g., seconds, minutes, hours)     sonstant 6. SEVERITY: "How bad is the pain?"  (e.g., Scale 1-10; mild, moderate, or severe)    - MILD (1-3): doesn't interfere with normal activities     - MODERATE (4-7): interferes with normal activities or awakens from sleep    - SEVERE (8-10): excruciating pain, unable to do any normal activities   7  7. CARDIAC RISK FACTORS: "Do you have any history of heart problems or risk factors for heart disease?" (e.g., prior heart attack, angina; high blood  pressure, diabetes, being overweight, high cholesterol, smoking, or strong family history of heart disease)    HTN 8. PULMONARY RISK FACTORS: "Do you have any history of lung disease?"  (e.g., blood clots in lung, asthma, emphysema, birth control pills)     no 9. CAUSE: "What do you think is causing the chest pain?"     Previously diagnosed with GERD 10. OTHER SYMPTOMS: "Do you have any other symptoms?" (e.g., dizziness, nausea, vomiting, sweating, fever, difficulty breathing, cough)       Nausea  Protocols used: CHEST PAIN-A-AH

## 2018-09-06 NOTE — Patient Instructions (Signed)
Please focus on dietary changes and continue pantoprazole as prescribed. You can increase this to 40 mg daily instead of 20 mg daily and follow up with Dr. Jonny Ruiz for further evaluation.  You are due for your preventive care, please schedule with Dr. Jonny Ruiz before you leave today.  If symptoms continue with trial of dietary changes and medication, stress testing can be ordered.   Nonspecific Chest Pain Chest pain can be caused by many different conditions. Some causes of chest pain can be life-threatening. These will require treatment right away. Serious causes of chest pain include:  Heart attack.  A tear in the body's main blood vessel.  Redness and swelling (inflammation) around your heart.  Blood clot in your lungs. Other causes of chest pain may not be so serious. These include:  Heartburn.  Anxiety or stress.  Damage to bones or muscles in your chest.  Lung infections. Chest pain can feel like:  Pain or discomfort in your chest.  Crushing, pressure, aching, or squeezing pain.  Burning or tingling.  Dull or sharp pain that is worse when you move, cough, or take a deep breath.  Pain or discomfort that is also felt in your back, neck, jaw, shoulder, or arm, or pain that spreads to any of these areas. It is hard to know whether your pain is caused by something that is serious or something that is not so serious. So it is important to see your doctor right away if you have chest pain. Follow these instructions at home: Medicines  Take over-the-counter and prescription medicines only as told by your doctor.  If you were prescribed an antibiotic medicine, take it as told by your doctor. Do not stop taking the antibiotic even if you start to feel better. Lifestyle   Rest as told by your doctor.  Do not use any products that contain nicotine or tobacco, such as cigarettes, e-cigarettes, and chewing tobacco. If you need help quitting, ask your doctor.  Do not drink alcohol.   Make lifestyle changes as told by your doctor. These may include: ? Getting regular exercise. Ask your doctor what activities are safe for you. ? Eating a heart-healthy diet. A diet and nutrition specialist (dietitian) can help you to learn healthy eating options. ? Staying at a healthy weight. ? Treating diabetes or high blood pressure, if needed. ? Lowering your stress. Activities such as yoga and relaxation techniques can help. General instructions  Pay attention to any changes in your symptoms. Tell your doctor about them or any new symptoms.  Avoid any activities that cause chest pain.  Keep all follow-up visits as told by your doctor. This is important. You may need more testing if your chest pain does not go away. Contact a doctor if:  Your chest pain does not go away.  You feel depressed.  You have a fever. Get help right away if:  Your chest pain is worse.  You have a cough that gets worse, or you cough up blood.  You have very bad (severe) pain in your belly (abdomen).  You pass out (faint).  You have either of these for no clear reason: ? Sudden chest discomfort. ? Sudden discomfort in your arms, back, neck, or jaw.  You have shortness of breath at any time.  You suddenly start to sweat, or your skin gets clammy.  You feel sick to your stomach (nauseous).  You throw up (vomit).  You suddenly feel lightheaded or dizzy.  You feel very weak  or tired.  Your heart starts to beat fast, or it feels like it is skipping beats. These symptoms may be an emergency. Do not wait to see if the symptoms will go away. Get medical help right away. Call your local emergency services (911 in the [place] Do not drive yourself to the hospital. Summary  Chest pain can be caused by many different conditions. The cause may be serious and need treatment right away. If you have chest pain, see your doctor right away.  Follow your doctor's instructions for taking medicines and  making lifestyle changes.  Keep all follow-up visits as told by your doctor. This includes visits for any further testing if your chest pain does not go away.  Be sure to know the signs that show that your condition has become worse. Get help right away if you have these symptoms. This information is not intended to replace advice given to you by your health care provider. Make sure you discuss any questions you have with your health care provider. Document Released: 11/30/2007 Document Revised: 12/14/2017 Document Reviewed: 12/14/2017 Elsevier Interactive Patient Education  2019 [employer].  Food Choices for Gastroesophageal Reflux Disease, Adult When you have gastroesophageal reflux disease (GERD), the foods you eat and your eating habits are very important. Choosing the right foods can help ease your discomfort. Think about working with a nutrition specialist (dietitian) to help you make good choices. What are tips for following this plan?  Meals  Choose healthy foods that are low in fat, such as fruits, vegetables, whole grains, low-fat dairy products, and lean meat, fish, and poultry.  Eat small meals often instead of 3 large meals a day. Eat your meals slowly, and in a place where you are relaxed. Avoid bending over or lying down until 2-3 hours after eating.  Avoid eating meals 2-3 hours before bed.  Avoid drinking a lot of liquid with meals.  Cook foods using methods other than frying. Bake, grill, or broil food instead.  Avoid or limit: ? Chocolate. ? Peppermint or spearmint. ? Alcohol. ? Pepper. ? Black and decaffeinated coffee. ? Black and decaffeinated tea. ? Bubbly (carbonated) soft drinks. ? Caffeinated energy drinks and soft drinks.  Limit high-fat foods such as: ? Fatty meat or fried foods. ? Whole milk, cream, butter, or ice cream. ? Nuts and nut butters. ? Pastries, donuts, and sweets made with butter or shortening.  Avoid foods that cause symptoms. These  foods may be different for everyone. Common foods that cause symptoms include: ? Tomatoes. ? Oranges, lemons, and limes. ? Peppers. ? Spicy food. ? Onions and garlic. ? Vinegar. Lifestyle  Maintain a healthy weight. Ask your doctor what weight is healthy for you. If you need to lose weight, work with your doctor to do so safely.  Exercise for at least 30 minutes for 5 or more days each week, or as told by your doctor.  Wear loose-fitting clothes.  Do not smoke. If you need help quitting, ask your doctor.  Sleep with the head of your bed higher than your feet. Use a wedge under the mattress or blocks under the bed frame to raise the head of the bed. Summary  When you have gastroesophageal reflux disease (GERD), food and lifestyle choices are very important in easing your symptoms.  Eat small meals often instead of 3 large meals a day. Eat your meals slowly, and in a place where you are relaxed.  Limit high-fat foods such as fatty meat or  fried foods.  Avoid bending over or lying down until 2-3 hours after eating.  Avoid peppermint and spearmint, caffeine, alcohol, and chocolate. This information is not intended to replace advice given to you by your health care provider. Make sure you discuss any questions you have with your health care provider. Document Released: 12/13/2011 Document Revised: 07/19/2016 Document Reviewed: 07/19/2016 Elsevier Interactive Patient Education  2019 [employer].

## 2018-09-06 NOTE — Progress Notes (Signed)
Subjective:    Patient ID: Kristen Ellison, female    DOB: [birth year], 23 y.o.   MRN: [id number]  HPI  Kristen Ellison is a 23 year old female who presents today with a chest pain that has been present for intermittently x 3 months. She reports that episodes of chest pain are occurring more frequently recently. Mother is present with patient who states that this symptom has been present intermittently for about a year.   CHEST PAIN  Location: mid/left side of chest  Quality: sharp and rated as a 5  Duration: consistently present over the past "few weeks Onset (rest, exertion): Not worse with exertion, not worse at night or with eating Radiation: No Better with: nothing Worse with: can worsen with deep breaths but not always  Symptoms History of Trauma/lifting: no  Nausea/vomiting: Mild Nausea, no vomiting Radiation to arms/jaw/neck: No Has radiated to back at times and this is not new Diaphoresis: yes, but not always  Shortness of breath: no  Pleuritic: no  Cough: no  Edema: no  Orthopnea: no  PND: no  Dizziness: no  Palpitations: no  Syncope: no  Indigestion: no  History of PCOS, obesity She is not on oral birth control.  Red Flags Worse with exertion: no  Recent Immobility: no  Cancer history: no  Tearing/radiation to back: no   She was evaluated on 08/28/2018 in the ED for chest pain that was noted as sharp and not worse with exertion. Mild nausea and some diaphoresis were present. Pain was not well localized or described as heaviness/pressure/tightness and did not radiate to arms/jaw/neck. She was evaluated with an EKG and was noted to have atypical chest pain and treated for GERD.   She has experienced this symptom previously and was provided a GI cocktail and she had a negative cardiac workup in the ED in July 2019  She is taking pantoprazole and sucralfate as prescribed with limited benefit.  She denies drinking caffeinated drinks.  She eats fried and fatty foods. She takes  daily ASA. She is obese    Review of Systems  Constitutional: Positive for diaphoresis. Negative for chills, fatigue and fever.  Respiratory: Negative for cough and shortness of breath.   Cardiovascular: Positive for chest pain. Negative for palpitations.  Gastrointestinal: Positive for nausea. Negative for abdominal pain, diarrhea and vomiting.  Musculoskeletal: Negative for myalgias.  Skin: Negative for rash.  Neurological: Negative for dizziness, weakness, light-headedness and headaches.  Psychiatric/Behavioral:       Denies depressed mood.    Past Medical History:  Diagnosis Date  . Polycystic ovary syndrome      Social History   Socioeconomic History  . Marital status: Single    Spouse name: Not on file  . Number of children: Not on file  . Years of education: Not on file  . Highest education level: Not on file  Occupational History  . Not on file  Social Needs  . Financial resource strain: Not on file  . Food insecurity:    Worry: Not on file    Inability: Not on file  . Transportation needs:    Medical: Not on file    Non-medical: Not on file  Tobacco Use  . Smoking status: Never Smoker  . Smokeless tobacco: Never Used  Substance and Sexual Activity  . Alcohol use: No  . Drug use: No  . Sexual activity: Not on file  Lifestyle  . Physical activity:    Days per week: Not on file  Minutes per session: Not on file  . Stress: Not on file  Relationships  . Social connections:    Talks on phone: Not on file    Gets together: Not on file    Attends religious service: Not on file    Active member of club or organization: Not on file    Attends meetings of clubs or organizations: Not on file    Relationship status: Not on file  . Intimate partner violence:    Fear of current or ex partner: Not on file    Emotionally abused: Not on file    Physically abused: Not on file    Forced sexual activity: Not on file  Other Topics Concern  . Not on file  Social  History Narrative  . Not on file    Past Surgical History:  Procedure Laterality Date  . TONSILLECTOMY      Family History  Problem Relation Age of Onset  . Hypertension Other   . Diabetes Other   . Cancer Other   . Asthma Mother   . Diabetes Mother   . Diabetes Father   . Hyperlipidemia Father   . Hypertension Father   . Diabetes Maternal Grandfather   . Heart disease Maternal Grandfather   . Hypertension Maternal Grandfather   . Hypertension Paternal Grandmother   . Hyperlipidemia Paternal Grandmother   . Diabetes Paternal Grandmother   . Diabetes Paternal Grandfather   . Hyperlipidemia Paternal Grandfather   . Hypertension Paternal Grandfather   . Kidney disease Paternal Grandfather     No Known Allergies  Current Outpatient Medications on File Prior to Visit  Medication Sig Dispense Refill  . aspirin EC 81 MG tablet Take 81 mg by mouth daily.    . hydrochlorothiazide (HYDRODIURIL) 25 MG tablet Take 1 tablet (25 mg total) by mouth daily. 90 tablet 0  . lisinopril (PRINIVIL,ZESTRIL) 10 MG tablet Take 1 tablet (10 mg total) by mouth daily. 30 tablet 0  . pantoprazole (PROTONIX) 20 MG tablet Take 1 tablet (20 mg total) by mouth daily for 30 days. 30 tablet 0  . sucralfate (CARAFATE) 1 g tablet Take 1 tablet (1 g total) by mouth 4 (four) times daily -  with meals and at bedtime for 30 days. 120 tablet 0   No current facility-administered medications on file prior to visit.     BP 130/72   Pulse (!) 105   Temp 98.7 F (37.1 C) (Oral)   Wt (!) 364 lb 12.8 oz (165.5 kg)   SpO2 97%   BMI 58.88 kg/m       Objective:   Physical Exam Constitutional:      Appearance: She is well-developed. She is obese.  Eyes:     General: No scleral icterus.    Pupils: Pupils are equal, round, and reactive to light.  Neck:     Musculoskeletal: Neck supple.  Cardiovascular:     Rate and Rhythm: Normal rate and regular rhythm.     Pulses:          Dorsalis pedis pulses are 2+  on the right side and 2+ on the left side.       Posterior tibial pulses are 2+ on the right side and 2+ on the left side.     Heart sounds: Normal heart sounds.     Comments: Central sternal tightness that is reproducible with palpation.  Pulmonary:     Effort: Pulmonary effort is normal.     Breath sounds:  Normal breath sounds.  Abdominal:     General: Bowel sounds are normal.     Palpations: Abdomen is soft.     Tenderness: There is no abdominal tenderness.  Lymphadenopathy:     Cervical: No cervical adenopathy.  Skin:    General: Skin is warm and dry.     Capillary Refill: Capillary refill takes less than 2 seconds.  Neurological:     Mental Status: She is alert.  Psychiatric:        Mood and Affect: Mood normal.        Speech: Speech normal.       Assessment & Plan:  1. Atypical chest pain Exam is reassuring. Findings consistent with prior work up from ED. Advised aggressive treatment of GERD and also avoidance of triggers that exacerbate GERD. Also question if there is some underlying anxiety that might be contributing to symptoms. She is due for a  physical and will follow up with PCP regarding trial of monitoring food and triggers of GERD with PPI for improvement.  EKG reviewed and discussed with supervising physician. No concerning findings noted on EKG. Disagree with computer reading of Atrial flutter noted. Consistent p waves present, HR: 100, no "sawtooth" appearance. No concerning findings on EKG for symptoms today. Supervising physician is in agreement with evaluation and plan.  2. Gastroesophageal reflux disease, esophagitis presence not specified Exam and symptoms with history are most consistent with GERD. She is not avoiding triggers for GERD and is obese. We reviewed foods/triggers to avoid that are known triggers for reflux including caffeine, nicotine, alcohol, NSAIDS, peppermint, and fatty foods. Further advised that she focus on weight loss as this can greatly  improve her symptoms. Pantoprazole can be increased short time to 40 mg if needed however she is motivated to focus on avoiding triggers and beginning weight loss.   Follow up with PCP for preventive health care and evaluation of GERD in the next 4 weeks after conservative measures and increasing pantoprazole.   I spent 25 minutes with this patient and greater than 50% of the time was spent in face to face counseling regarding GERD, triggers for GERD, dietary changes, medications for GERD, and s/sx for further evaluation. We also discussed that if symptoms do not improve, further evaluation such as stress testing and evaluation for symptoms of anxiety can be considered.   Close return precautions provided. Advised if worsening symptoms, she will seek medical attention.  Roddie Mc, FNP-C

## 2018-09-06 NOTE — Telephone Encounter (Signed)
Ok with me 

## 2018-09-07 NOTE — Telephone Encounter (Signed)
Okay- she needs a blood pressure follow-up.

## 2018-09-18 NOTE — Telephone Encounter (Signed)
I have tried to reach patient by phone.

## 2019-01-10 ENCOUNTER — Encounter: Payer: Self-pay | Admitting: Family

## 2019-01-15 ENCOUNTER — Encounter: Payer: Self-pay | Admitting: Family

## 2019-01-15 ENCOUNTER — Ambulatory Visit (INDEPENDENT_AMBULATORY_CARE_PROVIDER_SITE_OTHER): Payer: BC Managed Care – PPO | Admitting: Family

## 2019-01-15 ENCOUNTER — Other Ambulatory Visit: Payer: Self-pay

## 2019-01-15 ENCOUNTER — Other Ambulatory Visit (INDEPENDENT_AMBULATORY_CARE_PROVIDER_SITE_OTHER): Payer: BC Managed Care – PPO

## 2019-01-15 VITALS — BP 148/86 | HR 94 | Temp 98.1°F | Ht 66.0 in | Wt 356.8 lb

## 2019-01-15 DIAGNOSIS — I10 Essential (primary) hypertension: Secondary | ICD-10-CM | POA: Diagnosis not present

## 2019-01-15 DIAGNOSIS — R7309 Other abnormal glucose: Secondary | ICD-10-CM

## 2019-01-15 DIAGNOSIS — E282 Polycystic ovarian syndrome: Secondary | ICD-10-CM

## 2019-01-15 LAB — CBC WITH DIFFERENTIAL/PLATELET
Basophils Absolute: 0 10*3/uL (ref 0.0–0.1)
Basophils Relative: 0.4 % (ref 0.0–3.0)
Eosinophils Absolute: 0.2 10*3/uL (ref 0.0–0.7)
Eosinophils Relative: 2 % (ref 0.0–5.0)
HCT: 40.4 % (ref 36.0–46.0)
Hemoglobin: 13.4 g/dL (ref 12.0–15.0)
Lymphocytes Relative: 32.4 % (ref 12.0–46.0)
Lymphs Abs: 3.1 10*3/uL (ref 0.7–4.0)
MCHC: 33.1 g/dL (ref 30.0–36.0)
MCV: 81.6 fl (ref 78.0–100.0)
Monocytes Absolute: 0.8 10*3/uL (ref 0.1–1.0)
Monocytes Relative: 8.5 % (ref 3.0–12.0)
Neutro Abs: 5.4 10*3/uL (ref 1.4–7.7)
Neutrophils Relative %: 56.7 % (ref 43.0–77.0)
Platelets: 404 10*3/uL — ABNORMAL HIGH (ref 150.0–400.0)
RBC: 4.95 Mil/uL (ref 3.87–5.11)
RDW: 14.6 % (ref 11.5–14.6)
WBC: 9.6 10*3/uL (ref 4.5–10.5)

## 2019-01-15 LAB — COMPREHENSIVE METABOLIC PANEL
ALT: 16 U/L (ref 0–35)
AST: 11 U/L (ref 0–37)
Albumin: 4.2 g/dL (ref 3.5–5.2)
Alkaline Phosphatase: 66 U/L (ref 39–117)
BUN: 9 mg/dL (ref 6–23)
CO2: 26 mEq/L (ref 19–32)
Calcium: 9.1 mg/dL (ref 8.4–10.5)
Chloride: 105 mEq/L (ref 96–112)
Creatinine, Ser: 0.81 mg/dL (ref 0.40–1.20)
GFR: 89.42 mL/min (ref >60.00)
Glucose, Bld: 87 mg/dL (ref 70–99)
Potassium: 4.2 mEq/L (ref 3.5–5.1)
Sodium: 140 mEq/L (ref 135–145)
Total Bilirubin: 0.2 mg/dL (ref 0.2–1.2)
Total Protein: 8 g/dL (ref 6.0–8.3)

## 2019-01-15 LAB — LUTEINIZING HORMONE: LH: 5.72 m[IU]/mL

## 2019-01-15 LAB — HEMOGLOBIN A1C: Hgb A1c MFr Bld: 5.6 % (ref 4.6–6.5)

## 2019-01-15 LAB — FOLLICLE STIMULATING HORMONE: FSH: 2.4 m[IU]/mL

## 2019-01-15 MED ORDER — LISINOPRIL-HYDROCHLOROTHIAZIDE 20-25 MG PO TABS: 1.0000 | ORAL_TABLET | Freq: Every day | ORAL | 0 refills | Status: DC
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
## 2019-01-16 ENCOUNTER — Other Ambulatory Visit: Payer: Self-pay | Admitting: Family

## 2019-01-16 DIAGNOSIS — N926 Irregular menstruation, unspecified: Secondary | ICD-10-CM

## 2019-01-16 LAB — TESTOSTERONE, FREE, TOTAL, SHBG
Sex Hormone Binding: 21 nmol/L — ABNORMAL LOW (ref 24.6–122.0)
Testosterone, Free: 1.4 pg/mL (ref 0.0–4.2)
Testosterone: 27 ng/dL (ref 8–48)

## 2019-01-16 LAB — PROLACTIN: Prolactin: 11.9 ng/mL

## 2019-01-16 MED ORDER — MEDROXYPROGESTERONE ACETATE 10 MG PO TABS: 10.0000 mg | ORAL_TABLET | Freq: Every day | ORAL | 0 refills | Status: DC

## 2019-01-16 MED ORDER — METFORMIN HCL 500 MG PO TABS: 500.0000 mg | ORAL_TABLET | Freq: Every day | ORAL | 0 refills | Status: DC

## 2019-01-30 ENCOUNTER — Ambulatory Visit: Payer: BC Managed Care – PPO | Admitting: Family

## 2019-01-31 ENCOUNTER — Other Ambulatory Visit: Payer: BC Managed Care – PPO

## 2019-02-04 ENCOUNTER — Other Ambulatory Visit: Payer: Self-pay

## 2019-02-05 ENCOUNTER — Encounter: Payer: Self-pay | Admitting: Obstetrics & Gynecology

## 2019-02-05 ENCOUNTER — Other Ambulatory Visit: Payer: BC Managed Care – PPO

## 2019-02-05 ENCOUNTER — Encounter: Payer: Self-pay | Admitting: *Deleted

## 2019-02-05 ENCOUNTER — Ambulatory Visit: Payer: BC Managed Care – PPO | Admitting: Obstetrics & Gynecology

## 2019-02-05 ENCOUNTER — Telehealth: Payer: Self-pay | Admitting: *Deleted

## 2019-02-05 VITALS — BP 132/80 | Ht 66.0 in | Wt 352.0 lb

## 2019-02-05 DIAGNOSIS — Z01419 Encounter for gynecological examination (general) (routine) without abnormal findings: Secondary | ICD-10-CM | POA: Diagnosis not present

## 2019-02-05 DIAGNOSIS — N921 Excessive and frequent menstruation with irregular cycle: Secondary | ICD-10-CM

## 2019-02-05 DIAGNOSIS — N6324 Unspecified lump in the left breast, lower inner quadrant: Secondary | ICD-10-CM

## 2019-02-05 DIAGNOSIS — Z3009 Encounter for other general counseling and advice on contraception: Secondary | ICD-10-CM

## 2019-02-05 DIAGNOSIS — Z6841 Body Mass Index (BMI) 40.0 and over, adult: Secondary | ICD-10-CM

## 2019-02-05 NOTE — Patient Instructions (Signed)
1. Well female exam with routine gynecological exam General exam and more morbidly obese patient was normal except for a left breast nodule.  No coitarche, pelvic exam deferred.  Right breast exam normal.  Left breast exam with a nodule at 6-7 o'clock.  Labs with family nurse practitioner.  2. Menometrorrhagia Longstanding heavy periods, with menometrorrhagia in July.  Will rule out anemia with a CBC today.  Rule out endocrine abnormalities with a TSH and prolactin.  Follow-up with a pelvic ultrasound to rule out endometrial pathology, polyps and fibroids.  Will decide on management per findings. - CBC - TSH - Prolactin - US PELVIS TRANSVAGINAL NON-OB (TV ONLY); Future  3. Encounter for other general counseling or advice on contraception No coitarche.  4. Breast lump on left side at 7 o'clock position Left breast tender nodule at 6-7 o'clock externally measuring 1.5 x 1 cm, mobile.  We will proceed with a left mammography and ultrasound.  5. Class 3 severe obesity due to excess calories without serious comorbidity with body mass index (BMI) of 50.0 to 59.9 in adult [facility])  Recommend a low calorie/carb diet such as [employer].  Aerobic physical activities 5 times a week and weightlifting every 2 days.  Kristen Ellison, it was a pleasure seeing you today!  I will inform you of your results as soon as they are available.

## 2019-02-05 NOTE — Telephone Encounter (Signed)
-----   Message from Princess Bruins, MD sent at 02/05/2019  9:24 AM EDT ----- Regarding: Left breast Dx mammo/US Left breast tender nodule at 6-7 O'clock externally 1.5 x 1.0 cm.

## 2019-02-05 NOTE — Progress Notes (Signed)
Kristen Ellison 1995/07/04 [id number]   History:    23 y.o. G0 Single.  Lives with mother and sister.  RP:  New patient presenting for annual gyn exam   HPI: Menstrual period usually every month and heavy lasting 8 to 9 days.  In July, she continued to bleed the whole month.  She was prescribed Provera 10 mg/tab, 1 tablet daily for 7 days mid July which decreased the bleeding.  Last menstrual period, July 30 was back to her normal, lasting 8 to 9 days.  No pelvic pain.  No coitarche.  Rt breast normal.  Lt breast tender lump noted 2 days ago.  BMI 56.81.  Lost a few pounds recently as she started a lower calorie diet and walking more regularly with her mom.  Labs with Fam NP.  Past medical history,surgical history, family history and social history were all reviewed and documented in the EPIC chart.  Gynecologic History Patient's last menstrual period was 01/24/2019. Contraception: abstinence Last Pap: Never Last mammogram: Never Bone Density: Never Colonoscopy: Never  Obstetric History OB History  Gravida Para Term Preterm AB Living  0 0 0 0 0 0  SAB TAB Ectopic Multiple Live Births  0 0 0 0 0     ROS: A ROS was performed and pertinent positives and negatives are included in the history.  GENERAL: No fevers or chills. HEENT: No change in vision, no earache, sore throat or sinus congestion. NECK: No pain or stiffness. CARDIOVASCULAR: No chest pain or pressure. No palpitations. PULMONARY: No shortness of breath, cough or wheeze. GASTROINTESTINAL: No abdominal pain, nausea, vomiting or diarrhea, melena or bright red blood per rectum. GENITOURINARY: No urinary frequency, urgency, hesitancy or dysuria. MUSCULOSKELETAL: No joint or muscle pain, no back pain, no recent trauma. DERMATOLOGIC: No rash, no itching, no lesions. ENDOCRINE: No polyuria, polydipsia, no heat or cold intolerance. No recent change in weight. HEMATOLOGICAL: No anemia or easy bruising or bleeding. NEUROLOGIC: No headache,  seizures, numbness, tingling or weakness. PSYCHIATRIC: No depression, no loss of interest in normal activity or change in sleep pattern.     Exam:   BP 132/80   Ht 5\' 6"  (1.676 m)   Wt (!) 352 lb (159.7 kg)   LMP 01/24/2019   BMI 56.81 kg/m   Body mass index is 56.81 kg/m.  General appearance : Well developed well nourished female. No acute distress HEENT: Eyes: no retinal hemorrhage or exudates,  Neck supple, trachea midline, no carotid bruits, no thyroidmegaly Lungs: Clear to auscultation, no rhonchi or wheezes, or rib retractions  Heart: Regular rate and rhythm, no murmurs or gallops Breast:Examined in sitting and supine position were symmetrical in appearance.  Right: no palpable masses or tenderness,  no skin retraction, no nipple inversion, no nipple discharge, no skin discoloration.  Left: 1.5 x 1.0 cm nodule at 6-7 O'Clock at outside of the breast tissue, mobile, mildly tender.  No skin change.  No nipple discharge.  No axillary or supraclavicular lymphadenopathy bilaterally. Abdomen: no palpable masses or tenderness, no rebound or guarding Extremities: no edema or skin discoloration or tenderness  Pelvic: Deferred, no coitarche   Assessment/Plan:  23 y.o. female for annual exam  1. Well female exam with routine gynecological exam General exam and more morbidly obese patient was normal except for a left breast nodule.  No coitarche, pelvic exam deferred.  Right breast exam normal.  Left breast exam with a nodule at 6-7 o'clock.  Labs with family nurse practitioner.  2.  Menometrorrhagia Longstanding heavy periods, with menometrorrhagia in July.  Will rule out anemia with a CBC today.  Rule out endocrine abnormalities with a TSH and prolactin.  Follow-up with a pelvic ultrasound to rule out endometrial pathology, polyps and fibroids.  Will decide on management per findings. - CBC - TSH - Prolactin - US PELVIS TRANSVAGINAL NON-OB (TV ONLY); Future  3. Encounter for other  general counseling or advice on contraception No coitarche.  4. Breast lump on left side at 7 o'clock position Left breast tender nodule at 6-7 o'clock externally measuring 1.5 x 1 cm, mobile.  We will proceed with a left mammography and ultrasound.  5. Class 3 severe obesity due to excess calories without serious comorbidity with body mass index (BMI) of 50.0 to 59.9 in adult [facility])  Recommend a low calorie/carb diet such as [employer] diet.  Aerobic physical activities 5 times a week and weightlifting every 2 days.  Counseling on above issues and coordination of care more than 50% for 20 minutes.  Genia DelMarie-Lyne Luara Faye MD, 8:49 AM 02/05/2019

## 2019-02-05 NOTE — Telephone Encounter (Signed)
Patient scheduled at breast center on 02/12/19 @ 12:15, will send my chart message with appointment time and date.

## 2019-02-06 ENCOUNTER — Other Ambulatory Visit: Payer: BC Managed Care – PPO

## 2019-02-06 LAB — CBC
HCT: 40.4 % (ref 35.0–45.0)
Hemoglobin: 13.1 g/dL (ref 11.7–15.5)
MCH: 26.8 pg — ABNORMAL LOW (ref 27.0–33.0)
MCHC: 32.4 g/dL (ref 32.0–36.0)
MCV: 82.8 fL (ref 80.0–100.0)
MPV: 10.2 fL (ref 7.5–12.5)
Platelets: 437 10*3/uL — ABNORMAL HIGH (ref 140–400)
RBC: 4.88 10*6/uL (ref 3.80–5.10)
RDW: 14.1 % (ref 11.0–15.0)
WBC: 10.1 10*3/uL (ref 3.8–10.8)

## 2019-02-06 LAB — PROLACTIN: Prolactin: 11.4 ng/mL

## 2019-02-06 LAB — TSH: TSH: 2.16 mIU/L

## 2019-02-11 ENCOUNTER — Encounter: Payer: Self-pay | Admitting: *Deleted

## 2019-02-12 ENCOUNTER — Other Ambulatory Visit: Payer: Self-pay

## 2019-02-12 ENCOUNTER — Ambulatory Visit
Admission: RE | Admit: 2019-02-12 | Discharge: 2019-02-12 | Disposition: A | Discharge status: Home / Self Care | Payer: BC Managed Care – PPO | Source: Ambulatory Visit | Attending: Obstetrics & Gynecology | Admitting: Obstetrics & Gynecology

## 2019-02-12 DIAGNOSIS — N6324 Unspecified lump in the left breast, lower inner quadrant: Secondary | ICD-10-CM

## 2019-02-15 ENCOUNTER — Other Ambulatory Visit: Payer: Self-pay

## 2019-02-18 ENCOUNTER — Ambulatory Visit (INDEPENDENT_AMBULATORY_CARE_PROVIDER_SITE_OTHER): Payer: BC Managed Care – PPO

## 2019-02-18 ENCOUNTER — Other Ambulatory Visit: Payer: Self-pay | Admitting: Obstetrics & Gynecology

## 2019-02-18 ENCOUNTER — Other Ambulatory Visit: Payer: Self-pay

## 2019-02-18 ENCOUNTER — Encounter: Payer: Self-pay | Admitting: Obstetrics & Gynecology

## 2019-02-18 ENCOUNTER — Ambulatory Visit: Payer: BC Managed Care – PPO | Admitting: Obstetrics & Gynecology

## 2019-02-18 DIAGNOSIS — N921 Excessive and frequent menstruation with irregular cycle: Secondary | ICD-10-CM

## 2019-02-18 NOTE — Progress Notes (Signed)
    [place] May 31, 1996 [id number]        23 y.o.  G0P0000   RP: Menometrorrhagia x 1 month  HPI: Menometrorrhagia x 1 month in 12/2018.  LMP 01/27/2019 was normal, no bleeding since then.  No pelvic pain.  Abstinent.   OB History  Gravida Para Term Preterm AB Living  0 0 0 0 0 0  SAB TAB Ectopic Multiple Live Births  0 0 0 0 0    Past medical history,surgical history, problem list, medications, allergies, family history and social history were all reviewed and documented in the EPIC chart.   Directed ROS with pertinent positives and negatives documented in the history of present illness/assessment and plan.  Exam:  There were no vitals filed for this visit. General appearance:  Normal  Pelvic US today: T/A images.  Uterus anteverted homogeneous measuring 10.18 x 6.23 x 4.19 cm.  Endometrial lining thin and normal at 2.1 mm.  Right and left ovaries normal.  No apparent mass in the right or left adnexa.  No free fluid in the posterior to the sac.   Assessment/Plan:  23 y.o. G0  1. Menometrorrhagia Menometrorrhagia x1 month, currently resolved.  Last menstrual period January 27, 2019 was normal.  No bleeding since then.  No pelvic pain currently.  Pelvic ultrasound findings reviewed with patient.  Uterus is completely normal with a thin endometrial lining at 2.1 mm.  Ovaries are normal.  Patient reassured.  Patient is abstinent.  Decision to observe at this time, will call back if menometrorrhagia recurs.  Counseling on above issues and coordination of care more than 50% for 15 minutes.  Princess Bruins MD, 12:08 PM 02/18/2019

## 2019-02-18 NOTE — Patient Instructions (Signed)
1. Menometrorrhagia Menometrorrhagia x1 month, currently resolved.  Last menstrual period January 27, 2019 was normal.  No bleeding since then.  No pelvic pain currently.  Pelvic ultrasound findings reviewed with patient.  Uterus is completely normal with a thin endometrial lining at 2.1 mm.  Ovaries are normal.  Patient reassured.  Patient is abstinent.  Decision to observe at this time, will call back if menometrorrhagia recurs.  Kristen Ellison, it was a pleasure seeing you today!

## 2019-04-08 ENCOUNTER — Other Ambulatory Visit: Payer: Self-pay | Admitting: Family

## 2019-07-02 ENCOUNTER — Ambulatory Visit: Payer: BC Managed Care – PPO | Admitting: Internal Medicine

## 2019-07-02 ENCOUNTER — Other Ambulatory Visit: Payer: Self-pay

## 2019-07-02 NOTE — Progress Notes (Deleted)
Virtual Visit via Video Note  I connected with Kristen Ellison on 07/02/19 at  1:15 PM EST by a video enabled telemedicine application and verified that I am speaking with the correct person using two identifiers.   I discussed the limitations of evaluation and management by telemedicine and the availability of in person appointments. The patient expressed understanding and agreed to proceed.  Present for the visit:  Myself, Dr Cheryll Cockayne, [name].  The patient is currently at home and I am in the office.    No referring provider.    History of Present Illness: He is here for an acute visit for cold symptoms.  His symptoms started  He is experiencing   He has tried taking    Social History   Socioeconomic History  . Marital status: Single    Spouse name: Not on file  . Number of children: Not on file  . Years of education: Not on file  . Highest education level: Not on file  Occupational History  . Not on file  Tobacco Use  . Smoking status: Never Smoker  . Smokeless tobacco: Never Used  Substance and Sexual Activity  . Alcohol use: No  . Drug use: No  . Sexual activity: Never    Comment: virgin  Other Topics Concern  . Not on file  Social History Narrative  . Not on file   Social Determinants of Health   Financial Resource Strain:   . Difficulty of Paying Living Expenses: Not on file  Food Insecurity:   . Worried About Programme researcher, broadcasting/film/video in the Last Year: Not on file  . Ran Out of Food in the Last Year: Not on file  Transportation Needs:   . Lack of Transportation (Medical): Not on file  . Lack of Transportation (Non-Medical): Not on file  Physical Activity:   . Days of Exercise per Week: Not on file  . Minutes of Exercise per Session: Not on file  Stress:   . Feeling of Stress : Not on file  Social Connections:   . Frequency of Communication with Friends and Family: Not on file  . Frequency of Social Gatherings with Friends and Family: Not on  file  . Attends Religious Services: Not on file  . Active Member of Clubs or Organizations: Not on file  . Attends Banker Meetings: Not on file  . Marital Status: Not on file     Observations/Objective: Appears well in NAD   Assessment and Plan:  See Problem List for Assessment and Plan of chronic medical problems.   Follow Up Instructions:    I discussed the assessment and treatment plan with the patient. The patient was provided an opportunity to ask questions and all were answered. The patient agreed with the plan and demonstrated an understanding of the instructions.   The patient was advised to call back or seek an in-person evaluation if the symptoms worsen or if the condition fails to improve as anticipated.    Pincus Sanes, MD

## 2019-07-03 ENCOUNTER — Encounter: Payer: Self-pay | Admitting: Internal Medicine

## 2019-07-03 ENCOUNTER — Ambulatory Visit: Payer: BC Managed Care – PPO | Admitting: Internal Medicine

## 2019-08-21 ENCOUNTER — Other Ambulatory Visit: Payer: Self-pay | Admitting: Family

## 2019-08-21 MED ORDER — LISINOPRIL-HYDROCHLOROTHIAZIDE 20-25 MG PO TABS: 1.0000 | ORAL_TABLET | Freq: Every day | ORAL | 0 refills | Status: DC

## 2020-01-21 ENCOUNTER — Other Ambulatory Visit: Payer: Self-pay | Admitting: Family

## 2020-02-04 ENCOUNTER — Other Ambulatory Visit: Payer: Self-pay | Admitting: Family

## 2020-03-27 ENCOUNTER — Ambulatory Visit (INDEPENDENT_AMBULATORY_CARE_PROVIDER_SITE_OTHER): Payer: BC Managed Care – PPO | Admitting: Family

## 2020-03-27 ENCOUNTER — Encounter: Payer: Self-pay | Admitting: Family

## 2020-03-27 ENCOUNTER — Other Ambulatory Visit: Payer: Self-pay

## 2020-03-27 ENCOUNTER — Other Ambulatory Visit: Payer: Self-pay | Admitting: Family

## 2020-03-27 VITALS — BP 142/70 | HR 96 | Temp 98.6°F | Ht 66.0 in | Wt 378.4 lb

## 2020-03-27 DIAGNOSIS — Z0000 Encounter for general adult medical examination without abnormal findings: Secondary | ICD-10-CM

## 2020-03-27 DIAGNOSIS — Z13220 Encounter for screening for lipoid disorders: Secondary | ICD-10-CM

## 2020-03-27 DIAGNOSIS — Z23 Encounter for immunization: Secondary | ICD-10-CM

## 2020-03-27 DIAGNOSIS — E282 Polycystic ovarian syndrome: Secondary | ICD-10-CM

## 2020-03-27 LAB — COMPREHENSIVE METABOLIC PANEL
ALT: 17 U/L (ref 0–35)
AST: 13 U/L (ref 0–37)
Albumin: 4.1 g/dL (ref 3.5–5.2)
Alkaline Phosphatase: 65 U/L (ref 39–117)
BUN: 11 mg/dL (ref 6–23)
CO2: 28 mEq/L (ref 19–32)
Calcium: 9.4 mg/dL (ref 8.4–10.5)
Chloride: 104 mEq/L (ref 96–112)
Creatinine, Ser: 0.81 mg/dL (ref 0.40–1.20)
GFR: 88.42 mL/min (ref >60.00)
Glucose, Bld: 99 mg/dL (ref 70–99)
Potassium: 4.4 mEq/L (ref 3.5–5.1)
Sodium: 140 mEq/L (ref 135–145)
Total Bilirubin: 0.2 mg/dL (ref 0.2–1.2)
Total Protein: 8 g/dL (ref 6.0–8.3)

## 2020-03-27 LAB — LIPID PANEL
Cholesterol: 172 mg/dL (ref 0–200)
HDL: 40.4 mg/dL (ref >39.00)
LDL Cholesterol: 112 mg/dL — ABNORMAL HIGH (ref 0–99)
NonHDL: 132.02
Total CHOL/HDL Ratio: 4
Triglycerides: 102 mg/dL (ref 0.0–149.0)
VLDL: 20.4 mg/dL (ref 0.0–40.0)

## 2020-03-27 LAB — CBC WITH DIFFERENTIAL/PLATELET
Basophils Absolute: 0 10*3/uL (ref 0.0–0.1)
Basophils Relative: 0.3 % (ref 0.0–3.0)
Eosinophils Absolute: 0.2 10*3/uL (ref 0.0–0.7)
Eosinophils Relative: 1.8 % (ref 0.0–5.0)
HCT: 39.9 % (ref 36.0–46.0)
Hemoglobin: 13 g/dL (ref 12.0–15.0)
Lymphocytes Relative: 24.2 % (ref 12.0–46.0)
Lymphs Abs: 2.4 10*3/uL (ref 0.7–4.0)
MCHC: 32.7 g/dL (ref 30.0–36.0)
MCV: 82.4 fl (ref 78.0–100.0)
Monocytes Absolute: 0.7 10*3/uL (ref 0.1–1.0)
Monocytes Relative: 7.4 % (ref 3.0–12.0)
Neutro Abs: 6.7 10*3/uL (ref 1.4–7.7)
Neutrophils Relative %: 66.3 % (ref 43.0–77.0)
Platelets: 402 10*3/uL — ABNORMAL HIGH (ref 150.0–400.0)
RBC: 4.84 Mil/uL (ref 3.87–5.11)
RDW: 14.7 % (ref 11.5–15.5)
WBC: 10.1 10*3/uL (ref 4.0–10.5)

## 2020-03-27 LAB — HEMOGLOBIN A1C: Hgb A1c MFr Bld: 5.8 % (ref 4.6–6.5)

## 2020-03-27 LAB — TSH: TSH: 2.04 u[IU]/mL (ref 0.35–4.50)

## 2020-03-27 MED ORDER — LISINOPRIL-HYDROCHLOROTHIAZIDE 20-25 MG PO TABS: 1.0000 | ORAL_TABLET | Freq: Every day | ORAL | 1 refills | Status: DC

## 2020-03-27 MED ORDER — METFORMIN HCL 500 MG PO TABS: 500.0000 mg | ORAL_TABLET | Freq: Every day | ORAL | 1 refills | Status: DC

## 2020-03-27 NOTE — Addendum Note (Signed)
Addended by: Sabino Donovan on: 03/27/2020 03:39 PM   Modules accepted: Orders
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
## 2020-03-27 NOTE — Patient Instructions (Signed)
Please call your GYN to schedule a follow up; [phone]

## 2020-04-01 ENCOUNTER — Telehealth (INDEPENDENT_AMBULATORY_CARE_PROVIDER_SITE_OTHER): Payer: BC Managed Care – PPO | Admitting: Family

## 2020-04-01 ENCOUNTER — Telehealth: Payer: Self-pay

## 2020-04-01 DIAGNOSIS — J069 Acute upper respiratory infection, unspecified: Secondary | ICD-10-CM | POA: Diagnosis not present

## 2020-04-01 DIAGNOSIS — R519 Headache, unspecified: Secondary | ICD-10-CM

## 2020-04-01 MED ORDER — LISINOPRIL-HYDROCHLOROTHIAZIDE 20-25 MG PO TABS: 1.0000 | ORAL_TABLET | Freq: Every day | ORAL | 1 refills | Status: DC
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
## 2020-04-01 NOTE — Telephone Encounter (Signed)
LVM for pt to call office to schedule Covid test for 10/7

## 2020-04-02 ENCOUNTER — Other Ambulatory Visit: Payer: Self-pay | Admitting: Family

## 2020-04-02 DIAGNOSIS — J069 Acute upper respiratory infection, unspecified: Secondary | ICD-10-CM

## 2020-04-02 NOTE — Telephone Encounter (Signed)
Called and left message for patient to call me back with date and time she wanted to come by for testing.

## 2020-04-02 NOTE — Telephone Encounter (Signed)
Patient is coming 10/8 @11am  - [employer] car - # given.

## 2020-04-02 NOTE — Telephone Encounter (Signed)
Patient is calling back to set up covid test. Informed CMA will call back once she returns from lunch to set up.

## 2020-04-03 NOTE — Telephone Encounter (Signed)
Tried to reach patient today. No answer. Message left for patient to return call and let me know if she was still coming today.

## 2020-07-31 ENCOUNTER — Ambulatory Visit (INDEPENDENT_AMBULATORY_CARE_PROVIDER_SITE_OTHER): Payer: BC Managed Care – PPO | Admitting: Internal Medicine

## 2020-07-31 ENCOUNTER — Other Ambulatory Visit (INDEPENDENT_AMBULATORY_CARE_PROVIDER_SITE_OTHER): Payer: BC Managed Care – PPO

## 2020-07-31 ENCOUNTER — Encounter: Payer: Self-pay | Admitting: Internal Medicine

## 2020-07-31 ENCOUNTER — Other Ambulatory Visit: Payer: Self-pay

## 2020-07-31 VITALS — BP 150/84 | HR 100 | Temp 98.5°F | Ht 66.0 in | Wt 384.8 lb

## 2020-07-31 DIAGNOSIS — F5101 Primary insomnia: Secondary | ICD-10-CM | POA: Diagnosis not present

## 2020-07-31 DIAGNOSIS — I872 Venous insufficiency (chronic) (peripheral): Secondary | ICD-10-CM | POA: Diagnosis not present

## 2020-07-31 DIAGNOSIS — R071 Chest pain on breathing: Secondary | ICD-10-CM

## 2020-07-31 DIAGNOSIS — E559 Vitamin D deficiency, unspecified: Secondary | ICD-10-CM

## 2020-07-31 DIAGNOSIS — F419 Anxiety disorder, unspecified: Secondary | ICD-10-CM

## 2020-07-31 DIAGNOSIS — R079 Chest pain, unspecified: Secondary | ICD-10-CM

## 2020-07-31 DIAGNOSIS — F411 Generalized anxiety disorder: Secondary | ICD-10-CM

## 2020-07-31 DIAGNOSIS — I10 Essential (primary) hypertension: Secondary | ICD-10-CM

## 2020-07-31 MED ORDER — HYDROCHLOROTHIAZIDE 25 MG PO TABS: 25.0000 mg | ORAL_TABLET | Freq: Every day | ORAL | 3 refills | Status: DC

## 2020-07-31 MED ORDER — BUSPIRONE HCL 15 MG PO TABS: 15.0000 mg | ORAL_TABLET | Freq: Three times a day (TID) | ORAL | 5 refills | Status: DC | PRN

## 2020-07-31 MED ORDER — CITALOPRAM HYDROBROMIDE 10 MG PO TABS: 10.0000 mg | ORAL_TABLET | Freq: Every day | ORAL | 3 refills | Status: DC

## 2020-07-31 MED ORDER — OLMESARTAN MEDOXOMIL 40 MG PO TABS: 40.0000 mg | ORAL_TABLET | Freq: Every day | ORAL | 3 refills | Status: DC

## 2020-07-31 MED ORDER — HYDROXYZINE HCL 50 MG PO TABS: ORAL_TABLET | ORAL | 5 refills | Status: DC

## 2020-07-31 NOTE — Patient Instructions (Signed)
Ok to stop the lisinopril HCT  Please take all new medication as prescribed - the generic for Benicar 40 mg, HCT 25 mg, celexa 10 mg, as well buspar 15 mg three times daily as needed for anxiety, and atarax at bedtime for sleep  Please continue all other medications as before, including the metformin  You will be contacted regarding the referral for: counseling (psychology)  Please have the pharmacy call with any other refills you may need.  Please continue your efforts at being more active, low cholesterol diet, and weight control.  Please keep your appointments with your specialists as you may have planned  Please see your PCP in 3-4 weeks for follow up

## 2020-08-01 ENCOUNTER — Encounter: Payer: Self-pay | Admitting: Internal Medicine

## 2020-08-01 DIAGNOSIS — F411 Generalized anxiety disorder: Secondary | ICD-10-CM | POA: Insufficient documentation

## 2020-08-01 DIAGNOSIS — I10 Essential (primary) hypertension: Secondary | ICD-10-CM | POA: Insufficient documentation

## 2020-08-01 DIAGNOSIS — R079 Chest pain, unspecified: Secondary | ICD-10-CM | POA: Insufficient documentation

## 2020-08-01 DIAGNOSIS — G4700 Insomnia, unspecified: Secondary | ICD-10-CM | POA: Insufficient documentation

## 2020-08-01 DIAGNOSIS — I872 Venous insufficiency (chronic) (peripheral): Secondary | ICD-10-CM | POA: Insufficient documentation

## 2020-08-01 NOTE — Assessment & Plan Note (Signed)
Now super morbid, declines nutrition referral for now, cont to work on increased activity and less calorie diet

## 2020-08-01 NOTE — Assessment & Plan Note (Signed)
Mild LLE due to obesity, for compression stocking daily

## 2020-08-01 NOTE — Progress Notes (Signed)
Established Patient Office Visit  Subjective:  Patient ID: Kristen Ellison, female    DOB: 09-03-1995  Age: 25 y.o. MRN: [id number]      Chief Complaint: follow up with aunt for support and give most history for htn, anxiety, insomnia and LE swelling       HPI:  Kristen Ellison is a 25 y.o. female here with aunt as above, father now in ICU on ventilator seriously ill, pt with increased anxiety and very uncomfortable, though denies worsening depressive symptoms or SI or HI.  Pt appears to have long standing psychiatric issues for many years untreated, has never seen psychiatry, now super morbid obese, worsening itself over the past 6 months.  Pt now willing for treatment including meds and counseling.  Does have also worsening insomnia, and aunt is hoping for non controlled substance type treatment for this. BP has been steadily worsening as well, Pt denies increased sob or doe, wheezing, orthopnea, PND, increased LE swelling, palpitations, dizziness or syncope, except for 2 wks worsening LLE swelling later in the day after standing, and several fleeting sharp left chest pain in the past several wks nonexertional nonpleurtic.  Has had good compliance overall with losartan HCT   Pt denies polydipsia, polyuria , as well as worsening disordered breating with sleep or daytime somnolence .      Wt Readings from Last 3 Encounters:  07/31/20 (!) 384 lb 12.8 oz (174.5 kg)  03/27/20 (!) 378 lb 6.4 oz (171.6 kg)  02/05/19 (!) 352 lb (159.7 kg)   BP Readings from Last 3 Encounters:  07/31/20 (!) 150/84  03/27/20 (!) 142/70  02/05/19 132/80         Past Medical History:  Diagnosis Date  . Polycystic ovary syndrome    Past Surgical History:  Procedure Laterality Date  . TONSILLECTOMY      reports that she has never smoked. She has never used smokeless tobacco. She reports that she does not drink alcohol and does not use drugs. family history includes Asthma in her mother; Cancer in an  other family member; Diabetes in her father, maternal grandfather, mother, paternal grandfather, paternal grandmother, and another family member; Heart disease in her maternal grandfather; Hyperlipidemia in her father, paternal grandfather, and paternal grandmother; Hypertension in her father, maternal grandfather, paternal grandfather, paternal grandmother, and another family member; Kidney disease in her paternal grandfather. No Known Allergies Current Outpatient Medications on File Prior to Visit  Medication Sig Dispense Refill  . metFORMIN (GLUCOPHAGE) 500 MG tablet Take 1 tablet (500 mg total) by mouth daily after supper. 90 tablet 1   No current facility-administered medications on file prior to visit.        ROS:  All others reviewed and negative.  Objective        PE:  BP (!) 150/84 (BP Location: Left Arm, Patient Position: Sitting, Cuff Size: Large)   Pulse 100   Temp 98.5 F (36.9 C) (Oral)   Ht 5\' 6"  (1.676 m)   Wt (!) 384 lb 12.8 oz (174.5 kg)   SpO2 98%   BMI 62.11 kg/m                 Constitutional: Pt appears in NAD               HENT: Head: NCAT.                Right Ear: External ear normal.  Left Ear: External ear normal.                Eyes: . Pupils are equal, round, and reactive to light. Conjunctivae and EOM are normal               Nose: without d/c or deformity               Neck: Neck supple. Gross normal ROM               Cardiovascular: Normal rate and regular rhythm.                 Pulmonary/Chest: Effort normal and breath sounds without rales or wheezing.                Abd:  Soft, NT, ND, + BS, no organomegaly               Neurological: Pt is alert. At baseline orientation, motor grossly intact               Skin: Skin is warm. No rashes, no other new lesions, LE edema - traceLLE               Psychiatric: Pt behavior is normal without agitation but nervous flat affect  Assessment/Plan:  Kristen Ellison is a 25 y.o. Other or  two or more races [6] female with  has a past medical history of Polycystic ovary syndrome.   Micro: none  Cardiac tracings I have personally interpreted today:  ECG - NSR 88  Pertinent Radiological findings (summarize): none   Lab Results  Component Value Date   WBC 10.1 03/27/2020   HGB 13.0 03/27/2020   HCT 39.9 03/27/2020   PLT 402.0 (H) 03/27/2020   GLUCOSE 99 03/27/2020   CHOL 172 03/27/2020   TRIG 102.0 03/27/2020   HDL 40.40 03/27/2020   LDLCALC 112 (H) 03/27/2020   ALT 17 03/27/2020   AST 13 03/27/2020   NA 140 03/27/2020   K 4.4 03/27/2020   CL 104 03/27/2020   CREATININE 0.81 03/27/2020   BUN 11 03/27/2020   CO2 28 03/27/2020   TSH 2.04 03/27/2020   HGBA1C 5.8 03/27/2020     Assessment & Plan:   Problem List Items Addressed This Visit      Medium   Vitamin D deficiency    Last vitamin D Lab Results  Component Value Date   VD25OH 21 (L) 08/05/2015   Low last chck, to start oral replacement vit d3 2000 u qd, wth f/u lab next visit      Venous insufficiency    Mild LLE due to obesity, for compression stocking daily      Relevant Medications   olmesartan (BENICAR) 40 MG tablet   hydrochlorothiazide (HYDRODIURIL) 25 MG tablet   Morbid obesity ([facility])    Now super morbid, declines nutrition referral for now, cont to work on increased activity and less calorie diet      Insomnia    Mild to mod, for atarax qhs prn,  to f/u any worsening symptoms or concerns       HTN (hypertension)    BP Readings from Last 3 Encounters:  07/31/20 (!) 150/84  03/27/20 (!) 142/70  02/05/19 132/80   uncontrolled, pt to [facility] treatment from losartan HCT to benicar 40 qd, hct 25 qd, and cont to f/u bp at home  Current Outpatient Medications (Endocrine & Metabolic):  .  metFORMIN (GLUCOPHAGE) 500 MG tablet,  Take 1 tablet (500 mg total) by mouth daily after supper.  Current Outpatient Medications (Cardiovascular):  .  hydrochlorothiazide (HYDRODIURIL) 25 MG  tablet, Take 1 tablet (25 mg total) by mouth daily. Marland Kitchen  olmesartan (BENICAR) 40 MG tablet, Take 1 tablet (40 mg total) by mouth daily.     Current Outpatient Medications (Other):  .  busPIRone (BUSPAR) 15 MG tablet, Take 1 tablet (15 mg total) by mouth 3 (three) times daily as needed. .  citalopram (CELEXA) 10 MG tablet, Take 1 tablet (10 mg total) by mouth daily. .  hydrOXYzine (ATARAX/VISTARIL) 50 MG tablet, 1/2 - 1 tab by mouth at bedtime as needed       Relevant Medications   olmesartan (BENICAR) 40 MG tablet   hydrochlorothiazide (HYDRODIURIL) 25 MG tablet   Generalized anxiety disorder - Primary    Now situationally worse and here with aunt to request tx with non controlled substances; ok for celexa 10 qd, buspar tid prn, and refer counseling      Relevant Medications   busPIRone (BUSPAR) 15 MG tablet   citalopram (CELEXA) 10 MG tablet   hydrOXYzine (ATARAX/VISTARIL) 50 MG tablet     Low   Chest pain    ecg reviewed, very low suspicion for cardiac, exam benign, cont same tx for now       Other Visit Diagnoses    Anxiety       Relevant Medications   busPIRone (BUSPAR) 15 MG tablet   citalopram (CELEXA) 10 MG tablet   hydrOXYzine (ATARAX/VISTARIL) 50 MG tablet   Other Relevant Orders   Ambulatory referral to Psychology      Meds ordered this encounter  Medications  . olmesartan (BENICAR) 40 MG tablet    Sig: Take 1 tablet (40 mg total) by mouth daily.    Dispense:  90 tablet    Refill:  3  . hydrochlorothiazide (HYDRODIURIL) 25 MG tablet    Sig: Take 1 tablet (25 mg total) by mouth daily.    Dispense:  90 tablet    Refill:  3  . busPIRone (BUSPAR) 15 MG tablet    Sig: Take 1 tablet (15 mg total) by mouth 3 (three) times daily as needed.    Dispense:  90 tablet    Refill:  5  . citalopram (CELEXA) 10 MG tablet    Sig: Take 1 tablet (10 mg total) by mouth daily.    Dispense:  90 tablet    Refill:  3  . hydrOXYzine (ATARAX/VISTARIL) 50 MG tablet    Sig:  1/2 - 1 tab by mouth at bedtime as needed    Dispense:  30 tablet    Refill:  5    Follow-up: Return in about 4 weeks (around 08/28/2020).   Oliver Barre, MD 08/01/2020 3:40 PM [facility] - [facility]

## 2020-08-01 NOTE — Assessment & Plan Note (Signed)
Mild to mod, for atarax qhs prn,  to f/u any worsening symptoms or concerns

## 2020-08-01 NOTE — Assessment & Plan Note (Signed)
BP Readings from Last 3 Encounters:  07/31/20 (!) 150/84  03/27/20 (!) 142/70  02/05/19 132/80   uncontrolled, pt to [facility] treatment from losartan HCT to benicar 40 qd, hct 25 qd, and cont to f/u bp at home  Current Outpatient Medications (Endocrine & Metabolic):  .  metFORMIN (GLUCOPHAGE) 500 MG tablet, Take 1 tablet (500 mg total) by mouth daily after supper.  Current Outpatient Medications (Cardiovascular):  .  hydrochlorothiazide (HYDRODIURIL) 25 MG tablet, Take 1 tablet (25 mg total) by mouth daily. Marland Kitchen  olmesartan (BENICAR) 40 MG tablet, Take 1 tablet (40 mg total) by mouth daily.     Current Outpatient Medications (Other):  .  busPIRone (BUSPAR) 15 MG tablet, Take 1 tablet (15 mg total) by mouth 3 (three) times daily as needed. .  citalopram (CELEXA) 10 MG tablet, Take 1 tablet (10 mg total) by mouth daily. .  hydrOXYzine (ATARAX/VISTARIL) 50 MG tablet, 1/2 - 1 tab by mouth at bedtime as needed

## 2020-08-01 NOTE — Assessment & Plan Note (Signed)
ecg reviewed, very low suspicion for cardiac, exam benign, cont same tx for now

## 2020-08-01 NOTE — Assessment & Plan Note (Addendum)
Now situationally worse and here with aunt to request tx with non controlled substances; ok for celexa 10 qd, buspar tid prn, and refer counseling

## 2020-08-01 NOTE — Assessment & Plan Note (Signed)
Last vitamin D Lab Results  Component Value Date   VD25OH 21 (L) 08/05/2015   Low last chck, to start oral replacement vit d3 2000 u qd, wth f/u lab next visit

## 2020-08-22 ENCOUNTER — Other Ambulatory Visit: Payer: Self-pay | Admitting: Internal Medicine

## 2020-09-15 ENCOUNTER — Encounter: Payer: Self-pay | Admitting: Family

## 2020-10-06 ENCOUNTER — Other Ambulatory Visit: Payer: Self-pay

## 2020-10-06 ENCOUNTER — Encounter: Payer: Self-pay | Admitting: Family

## 2020-10-06 ENCOUNTER — Ambulatory Visit: Payer: BC Managed Care – PPO | Admitting: Family

## 2020-10-06 VITALS — BP 122/78 | HR 98 | Temp 99.1°F | Ht 66.0 in | Wt 391.2 lb

## 2020-10-06 DIAGNOSIS — E282 Polycystic ovarian syndrome: Secondary | ICD-10-CM

## 2020-10-06 DIAGNOSIS — I10 Essential (primary) hypertension: Secondary | ICD-10-CM | POA: Diagnosis not present

## 2020-10-06 DIAGNOSIS — F4321 Adjustment disorder with depressed mood: Secondary | ICD-10-CM | POA: Diagnosis not present

## 2020-10-06 DIAGNOSIS — R5383 Other fatigue: Secondary | ICD-10-CM

## 2020-10-06 LAB — CBC WITH DIFFERENTIAL/PLATELET
Basophils Absolute: 0 10*3/uL (ref 0.0–0.1)
Basophils Relative: 0.3 % (ref 0.0–3.0)
Eosinophils Absolute: 0.3 10*3/uL (ref 0.0–0.7)
Eosinophils Relative: 2.4 % (ref 0.0–5.0)
HCT: 40.3 % (ref 36.0–46.0)
Hemoglobin: 13 g/dL (ref 12.0–15.0)
Lymphocytes Relative: 32.1 % (ref 12.0–46.0)
Lymphs Abs: 4.3 10*3/uL — ABNORMAL HIGH (ref 0.7–4.0)
MCHC: 32.2 g/dL (ref 30.0–36.0)
MCV: 82.8 fl (ref 78.0–100.0)
Monocytes Absolute: 1.1 10*3/uL — ABNORMAL HIGH (ref 0.1–1.0)
Monocytes Relative: 8.5 % (ref 3.0–12.0)
Neutro Abs: 7.5 10*3/uL (ref 1.4–7.7)
Neutrophils Relative %: 56.7 % (ref 43.0–77.0)
Platelets: 417 10*3/uL — ABNORMAL HIGH (ref 150.0–400.0)
RBC: 4.86 Mil/uL (ref 3.87–5.11)
RDW: 15.1 % (ref 11.5–15.5)
WBC: 13.2 10*3/uL — ABNORMAL HIGH (ref 4.0–10.5)

## 2020-10-06 LAB — COMPREHENSIVE METABOLIC PANEL
ALT: 15 U/L (ref 0–35)
AST: 11 U/L (ref 0–37)
Albumin: 4 g/dL (ref 3.5–5.2)
Alkaline Phosphatase: 69 U/L (ref 39–117)
BUN: 11 mg/dL (ref 6–23)
CO2: 30 mEq/L (ref 19–32)
Calcium: 9.6 mg/dL (ref 8.4–10.5)
Chloride: 100 mEq/L (ref 96–112)
Creatinine, Ser: 0.79 mg/dL (ref 0.40–1.20)
GFR: 106.09 mL/min (ref >60.00)
Glucose, Bld: 94 mg/dL (ref 70–99)
Potassium: 4.1 mEq/L (ref 3.5–5.1)
Sodium: 139 mEq/L (ref 135–145)
Total Bilirubin: 0.2 mg/dL (ref 0.2–1.2)
Total Protein: 8.4 g/dL — ABNORMAL HIGH (ref 6.0–8.3)

## 2020-10-06 LAB — VITAMIN B12: Vitamin B-12: 417 pg/mL (ref 211–911)

## 2020-10-06 LAB — HEMOGLOBIN A1C: Hgb A1c MFr Bld: 6.1 % (ref 4.6–6.5)

## 2020-10-06 LAB — TSH: TSH: 4.23 u[IU]/mL (ref 0.35–4.50)

## 2020-10-06 MED ORDER — CITALOPRAM HYDROBROMIDE 20 MG PO TABS: 20.0000 mg | ORAL_TABLET | Freq: Every day | ORAL | 1 refills | Status: DC
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
## 2020-10-06 NOTE — Patient Instructions (Signed)
Please call Behavioral Health to get scheduled for therapy- [phone]

## 2020-10-17 ENCOUNTER — Other Ambulatory Visit: Payer: Self-pay | Admitting: Internal Medicine

## 2020-10-19 NOTE — Telephone Encounter (Signed)
Ok to let pt know  benicar not covered well with his insurance  So we changed to losartan 100 mg per day

## 2020-10-19 NOTE — Telephone Encounter (Signed)
Patient notified

## 2020-10-30 ENCOUNTER — Encounter: Payer: Self-pay | Admitting: Family

## 2020-10-30 ENCOUNTER — Ambulatory Visit (HOSPITAL_BASED_OUTPATIENT_CLINIC_OR_DEPARTMENT_OTHER)
Admission: RE | Admit: 2020-10-30 | Discharge: 2020-10-30 | Disposition: A | Discharge status: Home / Self Care | Payer: BC Managed Care – PPO | Source: Ambulatory Visit | Attending: Family | Admitting: Family

## 2020-10-30 ENCOUNTER — Ambulatory Visit: Payer: BC Managed Care – PPO | Admitting: Family

## 2020-10-30 ENCOUNTER — Other Ambulatory Visit: Payer: Self-pay

## 2020-10-30 VITALS — BP 150/90 | HR 99 | Temp 98.4°F | Ht 67.0 in | Wt >= 6400 oz

## 2020-10-30 DIAGNOSIS — R899 Unspecified abnormal finding in specimens from other organs, systems and tissues: Secondary | ICD-10-CM

## 2020-10-30 DIAGNOSIS — F339 Major depressive disorder, recurrent, unspecified: Secondary | ICD-10-CM

## 2020-10-30 DIAGNOSIS — R632 Polyphagia: Secondary | ICD-10-CM | POA: Diagnosis not present

## 2020-10-30 DIAGNOSIS — R059 Cough, unspecified: Secondary | ICD-10-CM | POA: Diagnosis not present

## 2020-10-30 DIAGNOSIS — F411 Generalized anxiety disorder: Secondary | ICD-10-CM

## 2020-10-30 LAB — CBC WITH DIFFERENTIAL/PLATELET
Basophils Absolute: 0 10*3/uL (ref 0.0–0.1)
Basophils Relative: 0.3 % (ref 0.0–3.0)
Eosinophils Absolute: 0.2 10*3/uL (ref 0.0–0.7)
Eosinophils Relative: 2.2 % (ref 0.0–5.0)
HCT: 36.2 % (ref 36.0–46.0)
Hemoglobin: 11.8 g/dL — ABNORMAL LOW (ref 12.0–15.0)
Lymphocytes Relative: 23.6 % (ref 12.0–46.0)
Lymphs Abs: 2.4 10*3/uL (ref 0.7–4.0)
MCHC: 32.5 g/dL (ref 30.0–36.0)
MCV: 83.6 fl (ref 78.0–100.0)
Monocytes Absolute: 0.6 10*3/uL (ref 0.1–1.0)
Monocytes Relative: 6.4 % (ref 3.0–12.0)
Neutro Abs: 6.8 10*3/uL (ref 1.4–7.7)
Neutrophils Relative %: 67.5 % (ref 43.0–77.0)
Platelets: 368 10*3/uL (ref 150.0–400.0)
RBC: 4.33 Mil/uL (ref 3.87–5.11)
RDW: 15.1 % (ref 11.5–15.5)
WBC: 10.1 10*3/uL (ref 4.0–10.5)

## 2020-10-30 LAB — TSH: TSH: 2.5 u[IU]/mL (ref 0.35–4.50)

## 2020-10-30 MED ORDER — WEGOVY 0.25 MG/0.5ML ~~LOC~~ SOAJ: 0.2500 mg | SUBCUTANEOUS | 0 refills | Status: DC

## 2020-10-30 MED ORDER — ALBUTEROL SULFATE HFA 108 (90 BASE) MCG/ACT IN AERS: 2.0000 | INHALATION_SPRAY | Freq: Four times a day (QID) | RESPIRATORY_TRACT | 2 refills | Status: DC | PRN

## 2020-10-30 MED ORDER — BUPROPION HCL ER (XL) 150 MG PO TB24: 150.0000 mg | ORAL_TABLET | Freq: Every day | ORAL | 0 refills | Status: DC
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
## 2020-11-05 ENCOUNTER — Ambulatory Visit: Payer: BC Managed Care – PPO | Admitting: Family

## 2020-11-09 ENCOUNTER — Ambulatory Visit (INDEPENDENT_AMBULATORY_CARE_PROVIDER_SITE_OTHER): Payer: BC Managed Care – PPO | Admitting: Psychology

## 2020-11-09 DIAGNOSIS — F321 Major depressive disorder, single episode, moderate: Secondary | ICD-10-CM | POA: Diagnosis not present

## 2020-11-26 ENCOUNTER — Ambulatory Visit (INDEPENDENT_AMBULATORY_CARE_PROVIDER_SITE_OTHER): Payer: BC Managed Care – PPO | Admitting: Psychology

## 2020-11-26 DIAGNOSIS — F321 Major depressive disorder, single episode, moderate: Secondary | ICD-10-CM

## 2020-12-01 ENCOUNTER — Ambulatory Visit: Payer: BC Managed Care – PPO | Admitting: Family

## 2020-12-01 ENCOUNTER — Other Ambulatory Visit: Payer: Self-pay

## 2020-12-01 ENCOUNTER — Encounter: Payer: Self-pay | Admitting: Family

## 2020-12-01 VITALS — BP 128/84 | HR 96 | Temp 97.9°F | Resp 19 | Ht 67.0 in | Wt 399.0 lb

## 2020-12-01 DIAGNOSIS — I10 Essential (primary) hypertension: Secondary | ICD-10-CM | POA: Diagnosis not present

## 2020-12-01 DIAGNOSIS — F339 Major depressive disorder, recurrent, unspecified: Secondary | ICD-10-CM | POA: Diagnosis not present
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
## 2020-12-17 IMAGING — US ULTRASOUND LEFT BREAST LIMITED
1 series · 9 of 9 positions shown · non-contrast
Comparison: Previous exam(s).

CLINICAL DATA: Patient presents for an area of new focal pain in
the inferior left breast.

EXAM:
ULTRASOUND OF THE LEFT BREAST

[Series 1: ultrasound left breast limited · 0.06mm/px · 9 of 9 slices shown]
[im 1/9]
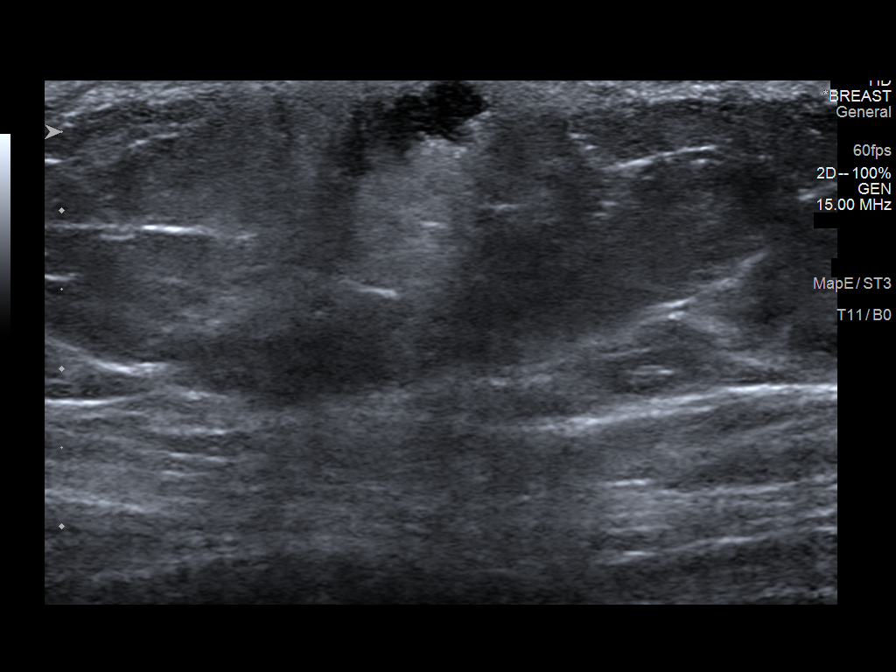
[im 2/9]
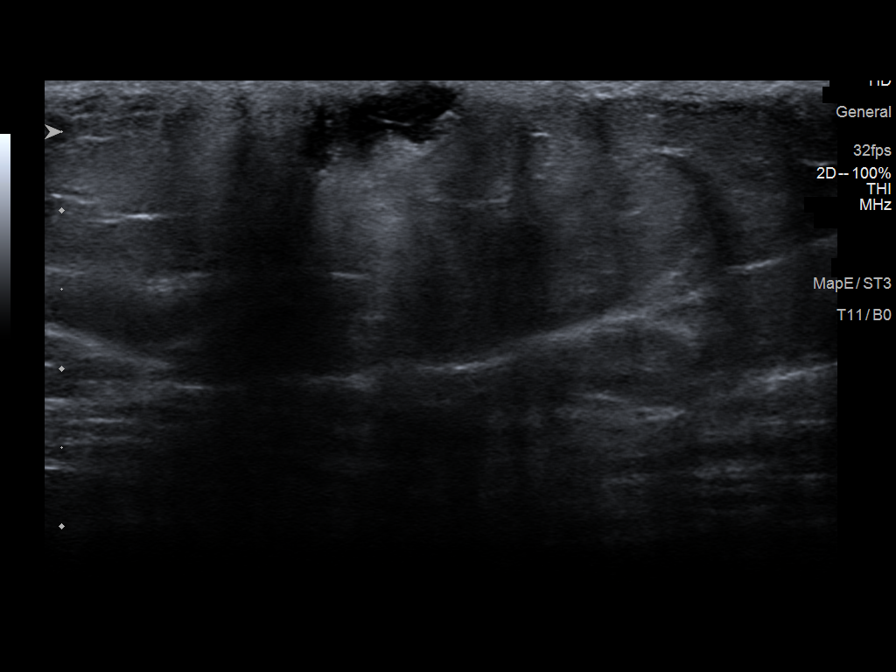
[im 3/9]
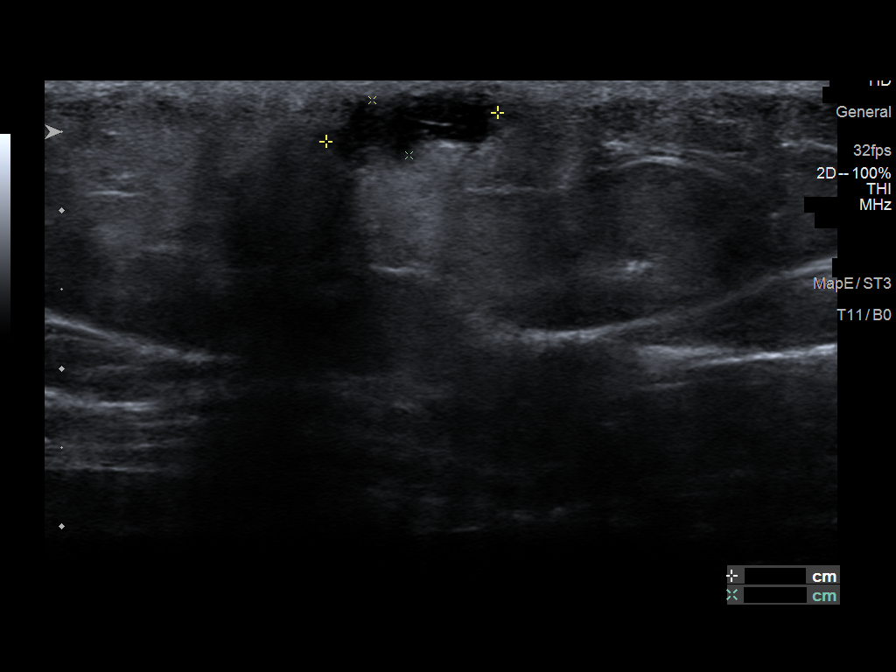
[im 4/9]
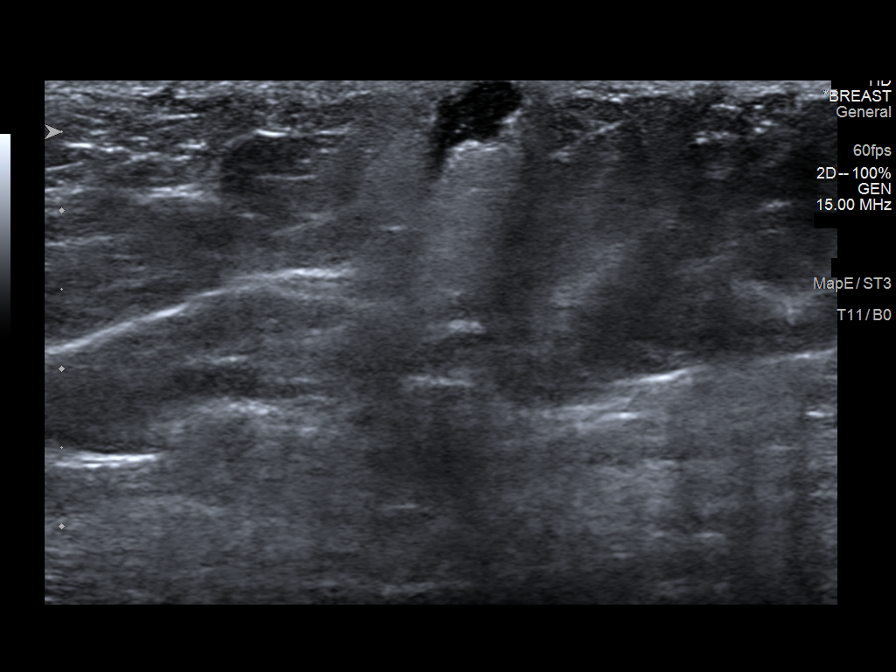
[im 5/9]
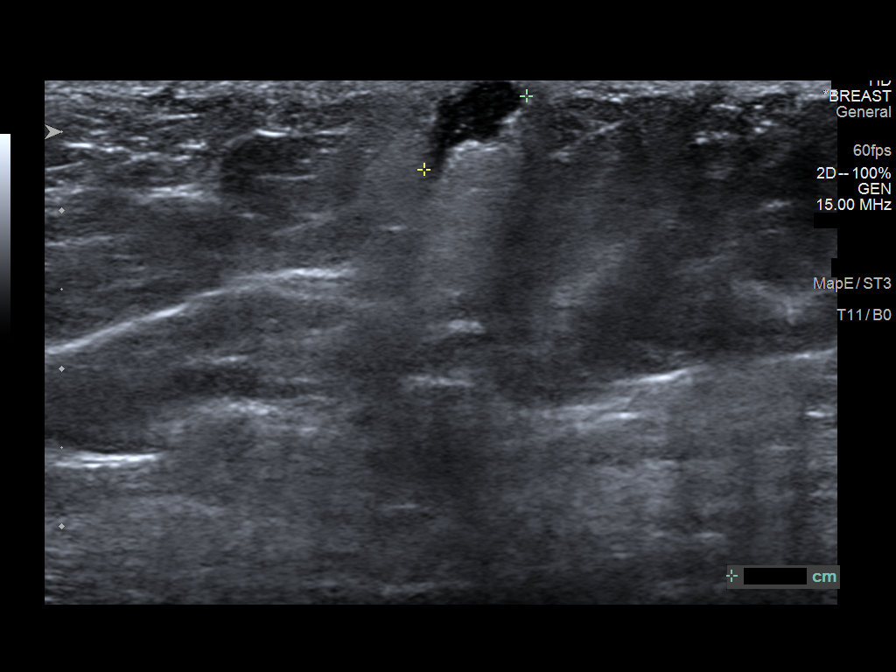
[im 6/9]
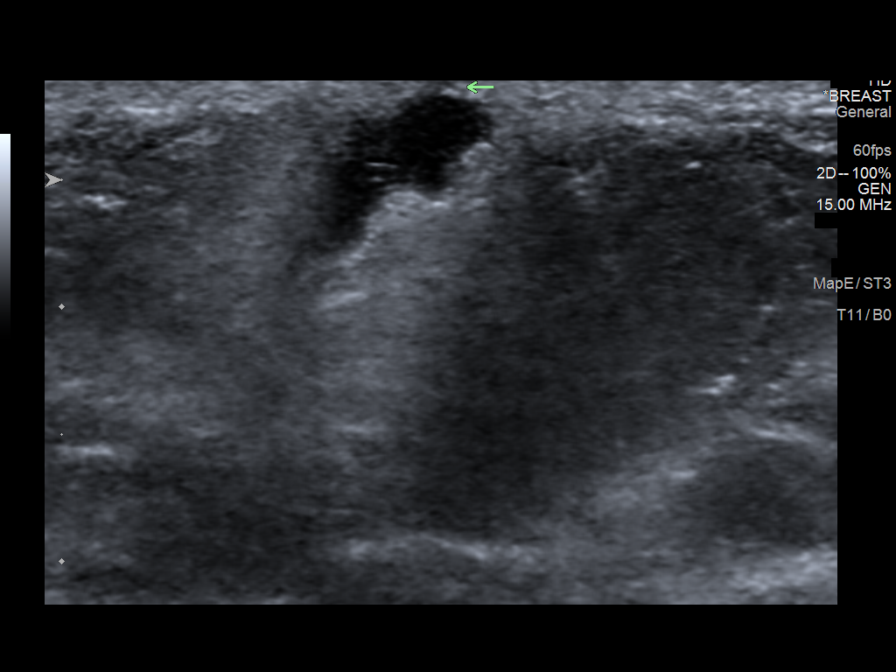
[im 7/9]
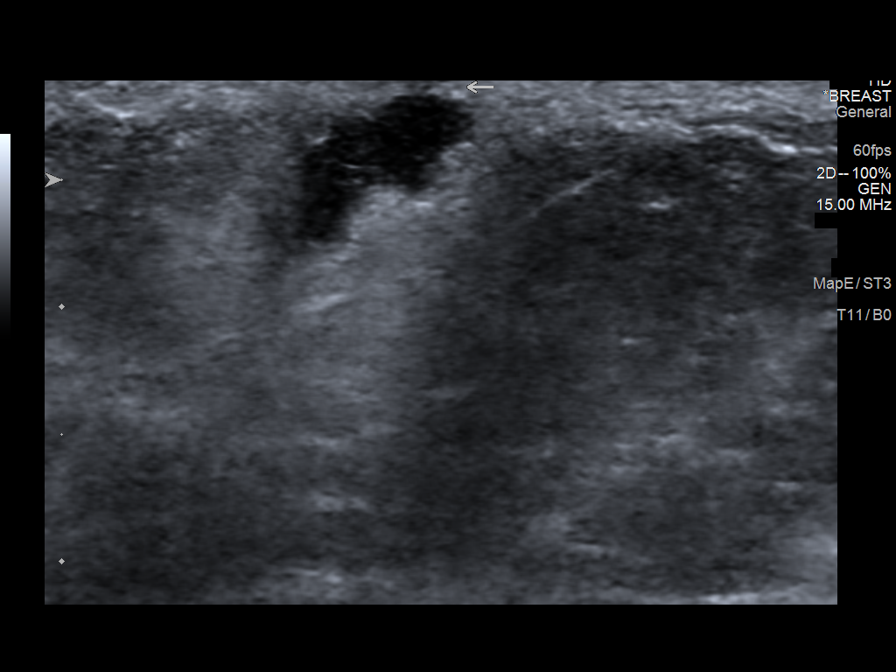
[im 8/9]
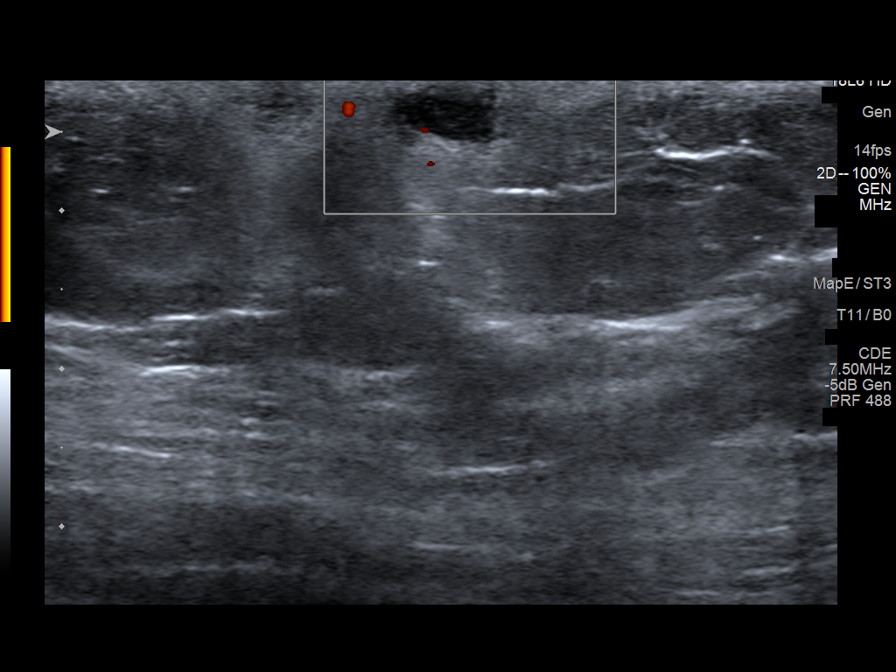
[im 9/9]
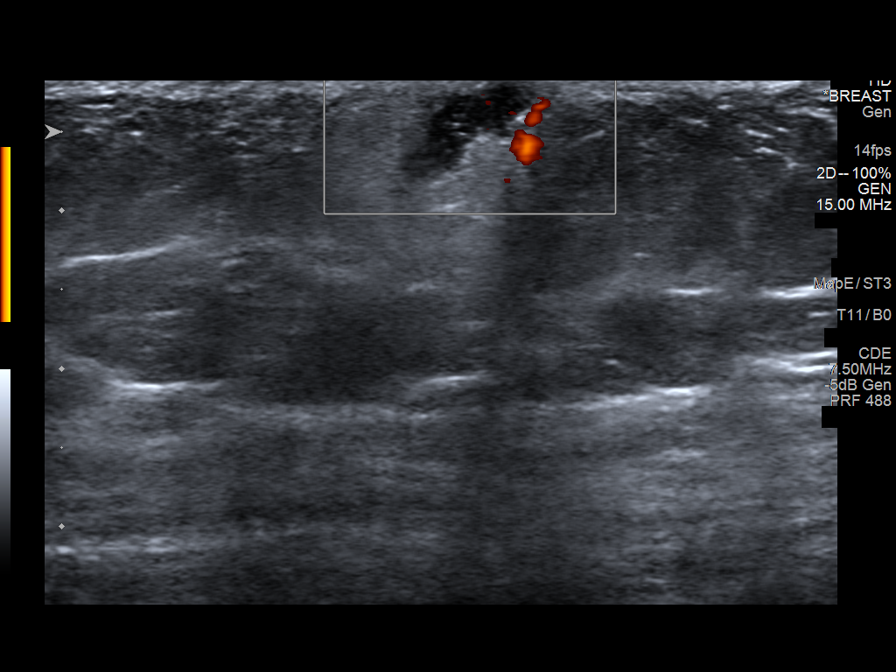

[9 of 9 positions shown; findings below may reference images not displayed]

FINDINGS: On physical exam, there is some skin thickening and a small area of
discoloration at the site of pain at 6 o'clock in the left breast.
No discrete mass is palpated.

Targeted ultrasound is performed, showing an oval anechoic mass
measuring 1.1 x 0.4 x 0.8 cm within the skin of the left breast at 6
o'clock 7 cm from the nipple corresponding to the site of pain.
There is a poor connecting to the skin surface. There is surrounding
skin thickening likely representing inflammation.
IMPRESSION: Left breast skin mass at 6 o'clock measuring 1.1 cm most likely
represents an inflamed sebaceous cyst. Patient was counseled to use
a warm compress and avoid underwire bra are other irritating
clothing.

RECOMMENDATION:
Clinical follow-up as needed. The patient was instructed to return
should the pain worsen or she develops signs of infection.

I have discussed the findings and recommendations with the patient.
Results were also provided in writing at the conclusion of the
visit. If applicable, a reminder letter will be sent to the patient
regarding the next appointment.

BI-RADS CATEGORY  2: Benign.

## 2020-12-21 ENCOUNTER — Ambulatory Visit (INDEPENDENT_AMBULATORY_CARE_PROVIDER_SITE_OTHER): Payer: BC Managed Care – PPO | Admitting: Psychology

## 2020-12-21 DIAGNOSIS — F321 Major depressive disorder, single episode, moderate: Secondary | ICD-10-CM | POA: Diagnosis not present

## 2020-12-29 ENCOUNTER — Ambulatory Visit (INDEPENDENT_AMBULATORY_CARE_PROVIDER_SITE_OTHER): Payer: BC Managed Care – PPO | Admitting: Psychology

## 2020-12-29 DIAGNOSIS — F321 Major depressive disorder, single episode, moderate: Secondary | ICD-10-CM

## 2020-12-30 ENCOUNTER — Telehealth (HOSPITAL_COMMUNITY): Payer: Self-pay | Admitting: Licensed Clinical Social Worker

## 2021-01-04 ENCOUNTER — Telehealth (HOSPITAL_COMMUNITY): Payer: Self-pay | Admitting: Licensed Clinical Social Worker

## 2021-01-06 ENCOUNTER — Ambulatory Visit (INDEPENDENT_AMBULATORY_CARE_PROVIDER_SITE_OTHER): Payer: BC Managed Care – PPO | Admitting: Psychology

## 2021-01-06 DIAGNOSIS — F321 Major depressive disorder, single episode, moderate: Secondary | ICD-10-CM | POA: Diagnosis not present

## 2021-01-13 ENCOUNTER — Ambulatory Visit: Payer: BC Managed Care – PPO | Admitting: Psychology

## 2021-01-20 ENCOUNTER — Ambulatory Visit (INDEPENDENT_AMBULATORY_CARE_PROVIDER_SITE_OTHER): Payer: BC Managed Care – PPO | Admitting: Psychology

## 2021-01-20 DIAGNOSIS — F321 Major depressive disorder, single episode, moderate: Secondary | ICD-10-CM

## 2021-01-21 ENCOUNTER — Ambulatory Visit (HOSPITAL_COMMUNITY): Payer: BC Managed Care – PPO

## 2021-01-25 ENCOUNTER — Other Ambulatory Visit: Payer: Self-pay | Admitting: Family

## 2021-02-02 ENCOUNTER — Ambulatory Visit: Payer: BC Managed Care – PPO | Admitting: Family

## 2021-02-05 ENCOUNTER — Ambulatory Visit: Payer: BC Managed Care – PPO | Admitting: Family

## 2021-02-08 ENCOUNTER — Other Ambulatory Visit (HOSPITAL_COMMUNITY): Payer: BC Managed Care – PPO | Attending: Psychiatry | Admitting: Occupational Therapy

## 2021-02-08 ENCOUNTER — Encounter (HOSPITAL_COMMUNITY): Payer: Self-pay

## 2021-02-08 ENCOUNTER — Other Ambulatory Visit (HOSPITAL_COMMUNITY): Payer: BC Managed Care – PPO | Admitting: Licensed Clinical Social Worker

## 2021-02-08 ENCOUNTER — Other Ambulatory Visit: Payer: Self-pay

## 2021-02-08 DIAGNOSIS — F332 Major depressive disorder, recurrent severe without psychotic features: Secondary | ICD-10-CM

## 2021-02-08 DIAGNOSIS — R41844 Frontal lobe and executive function deficit: Secondary | ICD-10-CM

## 2021-02-08 DIAGNOSIS — F32A Depression, unspecified: Secondary | ICD-10-CM | POA: Insufficient documentation

## 2021-02-08 DIAGNOSIS — R4589 Other symptoms and signs involving emotional state: Secondary | ICD-10-CM
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
## 2021-02-09 ENCOUNTER — Other Ambulatory Visit (HOSPITAL_COMMUNITY): Payer: BC Managed Care – PPO | Admitting: Licensed Clinical Social Worker

## 2021-02-09 ENCOUNTER — Encounter (HOSPITAL_COMMUNITY): Payer: Self-pay | Admitting: Family

## 2021-02-09 ENCOUNTER — Other Ambulatory Visit (HOSPITAL_COMMUNITY): Payer: BC Managed Care – PPO | Admitting: Occupational Therapy

## 2021-02-09 ENCOUNTER — Other Ambulatory Visit: Payer: Self-pay

## 2021-02-09 ENCOUNTER — Ambulatory Visit: Payer: BC Managed Care – PPO | Admitting: Family

## 2021-02-09 ENCOUNTER — Encounter (HOSPITAL_COMMUNITY): Payer: Self-pay

## 2021-02-09 ENCOUNTER — Encounter: Payer: Self-pay | Admitting: Family

## 2021-02-09 VITALS — BP 140/94 | Ht 67.0 in | Wt >= 6400 oz

## 2021-02-09 DIAGNOSIS — I10 Essential (primary) hypertension: Secondary | ICD-10-CM

## 2021-02-09 DIAGNOSIS — F339 Major depressive disorder, recurrent, unspecified: Secondary | ICD-10-CM | POA: Diagnosis not present

## 2021-02-09 DIAGNOSIS — F32A Depression, unspecified: Secondary | ICD-10-CM | POA: Diagnosis not present

## 2021-02-09 DIAGNOSIS — F332 Major depressive disorder, recurrent severe without psychotic features: Secondary | ICD-10-CM

## 2021-02-09 DIAGNOSIS — R41844 Frontal lobe and executive function deficit: Secondary | ICD-10-CM

## 2021-02-09 DIAGNOSIS — R4589 Other symptoms and signs involving emotional state: Secondary | ICD-10-CM

## 2021-02-09 MED ORDER — CITALOPRAM HYDROBROMIDE 20 MG PO TABS: 20.0000 mg | ORAL_TABLET | Freq: Every day | ORAL | 1 refills | Status: DC

## 2021-02-09 MED ORDER — HYDROCHLOROTHIAZIDE 25 MG PO TABS: 25.0000 mg | ORAL_TABLET | Freq: Every day | ORAL | 3 refills | Status: DC

## 2021-02-09 MED ORDER — LOSARTAN POTASSIUM 100 MG PO TABS: 100.0000 mg | ORAL_TABLET | Freq: Every day | ORAL | 3 refills | Status: DC

## 2021-02-09 MED ORDER — HYDROXYZINE HCL 50 MG PO TABS: ORAL_TABLET | ORAL | 1 refills | Status: DC
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
## 2021-02-09 NOTE — Progress Notes (Signed)
Spoke with patient via Webex video call, used 2 identifiers to correctly identify patient. States this is her first time in [facility] as recommended by her psychiatrist. Her father passed away in 17-Feb-2022and she became suicidal and depressed. Also started cutting after his death. She no longer feels suicidal or doing self harm but continues to have depression with anxiety. Has a decreased appetite, wants to stay in bed and has insomnia. On a scale of 1-10 as 10 being worst she rates depression at 5 and anxiety at 5. Denies SI/HI or AV hallucinations. PHQ9=17. No side effects from medications. No issue or complaints. So far enjoying group therapy.

## 2021-02-09 NOTE — Progress Notes (Signed)
Virtual Visit via Video Note  I connected with Kristen Ellison on 02/09/21 at  3:00 PM EDT by a video enabled telemedicine application and verified that I am speaking with the correct person using two identifiers.  Location: Patient: Home Provider:  Office   I discussed the limitations of evaluation and management by telemedicine and the availability of in person appointments. The patient expressed understanding and agreed to proceed.    I discussed the assessment and treatment plan with the patient. The patient was provided an opportunity to ask questions and all were answered. The patient agreed with the plan and demonstrated an understanding of the instructions.   The patient was advised to call back or seek an in-person evaluation if the symptoms worsen or if the condition fails to improve as anticipated.  I provided 15 minutes of non-face-to-face time during this encounter.   Oneta Rack, NP   Behavioral Health Partial Program Assessment Note  Date: 02/09/2021 Name: Kristen Ellison MRN: [id number]  Chief Complaint: Worsening depression  Kristen Ellison reported struggling with symptoms related to grief and loss due to the recent passing of her father.   HPI: Kristen Ellison is a 25 y.o. female presents with depression related to grief and loss.  She reports the passing of her father this past February.  Currently she is denying suicidal homicidal ideations.  Denies auditory or visual hallucinations.  Patient reports she is currently followed by her primary care which she is prescribed Wellbutrin 150 mg p.o. daily, BuSpar 15 mg p.o. 3 times daily daily Celexa 20 mg p.o. daily hydroxyzine 50 mg p.o. 3 times daily as needed which she reports taking and tolerating medications well.  States she is followed by therapist Jeani Sow.  She denied previous inpatient admissions.  Reports a history of self injures behaviors.  States she cut recently 2 weeks prior denies it was a  suicide attempt.  Denied history of physical sexual abuse.  Denies illicit drug use.  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  patient was enrolled in partial psychiatric program on 02/09/21.  Primary complaints include: depression worse, difficulty with school, and poor concentration.  Onset of symptoms was gradual with gradually worsening course since that time. Psychosocial Stressors include the following: family. She denied family history with mental illness   I have reviewed the following documentation dated 02/09/2021: past psychiatric history, past medical history, and past social and family history  Complaints of Pain: nonear Past Psychiatric History: None  Currently in treatment with Wellbutrin 150 mg p.o. daily, BuSpar 15 mg p.o. 3 times daily daily Celexa 20 mg p.o. daily hydroxyzine 50 mg p.o. 3 times daily   Substance Abuse History: none Use of Alcohol: denied Use of Caffeine: denies use Use of over the counter:   Past Surgical History:  Procedure Laterality Date   TONSILLECTOMY      Past Medical History:  Diagnosis Date   Polycystic ovary syndrome    Outpatient Encounter Medications as of 02/09/2021  Medication Sig   albuterol (VENTOLIN HFA) 108 (90 Base) MCG/ACT inhaler Inhale 2 puffs into the lungs every 6 (six) hours as needed for wheezing or shortness of breath.   buPROPion (WELLBUTRIN XL) 150 MG 24 hr tablet TAKE 1 TABLET BY MOUTH EVERY DAY   busPIRone (BUSPAR) 15 MG tablet TAKE 1 TABLET (15 MG TOTAL) BY MOUTH 3 (THREE) TIMES DAILY AS NEEDED.   citalopram (CELEXA) 20 MG tablet Take 1 tablet (20 mg total) by  mouth daily.   hydrochlorothiazide (HYDRODIURIL) 25 MG tablet Take 1 tablet (25 mg total) by mouth daily.   hydrOXYzine (ATARAX/VISTARIL) 50 MG tablet TAKE 1/2 - 1 TABLET BY MOUTH AT BEDTIME AS NEEDED   losartan (COZAAR) 100 MG tablet Take 1 tablet (100 mg total) by mouth daily.   metFORMIN (GLUCOPHAGE) 500 MG tablet Take 1 tablet (500  mg total) by mouth daily after supper.   Semaglutide-Weight Management (WEGOVY) 0.25 MG/0.5ML SOAJ Inject 0.25 mg into the skin once a week. (Patient not taking: No sig reported)   No facility-administered encounter medications on file as of 02/09/2021.   No Known Allergies  Social History   Tobacco Use   Smoking status: Never   Smokeless tobacco: Never  Substance Use Topics   Alcohol use: No   Functioning Relationships: good support system Education: College       Please specify degree: Theater  Other Pertinent History: None Family History  Problem Relation Age of Onset   Hypertension Other    Diabetes Other    Cancer Other    Asthma Mother    Diabetes Mother    Diabetes Father    Hyperlipidemia Father    Hypertension Father    Diabetes Maternal Grandfather    Heart disease Maternal Grandfather    Hypertension Maternal Grandfather    Hypertension Paternal Grandmother    Hyperlipidemia Paternal Grandmother    Diabetes Paternal Grandmother    Diabetes Paternal Grandfather    Hyperlipidemia Paternal Grandfather    Hypertension Paternal Grandfather    Kidney disease Paternal Grandfather      Review of Systems Constitutional:    Objective:  There were no vitals filed for this visit.  Physical Exam:   Mental Status Exam: Appearance:  Well groomed Psychomotor::  Within Normal Limits Attention span and concentration: Normal Behavior: calm, cooperative, and adequate rapport can be established Speech:  normal pitch Mood:  depressed and anxious Affect:  normal Thought Process:  Coherent Thought Content:  Logical Orientation:  person, place, and time/date Cognition:  grossly intact Insight:  Intact Judgment:  Intact Estimate of Intelligence: Average Fund of knowledge: Intact Memory: Recent and remote intact Abnormal movements: None Gait and station: Normal  Assessment:  Diagnosis: Severe episode of recurrent major depressive disorder, without psychotic  features (HCC) [F33.2] 1. Severe episode of recurrent major depressive disorder, without psychotic features (HCC)   2. Difficulty coping     Indications for admission: may follow-up with outpatient providers   Plan: Orders placed for occupational therapy  patient enrolled in Partial Hospitalization Program, patient's current medications are to be continued, a comprehensive treatment plan will be developed, and side effects of medications have been reviewed with patient  Treatment options and alternatives reviewed with patient and patient understands the above plan. Treatment plan was reviewed and agreed upon by NP T.Melvyn Neth and patient Kristen Ellison need for group services.     Oneta Rack, NP

## 2021-02-10 ENCOUNTER — Other Ambulatory Visit (HOSPITAL_COMMUNITY): Payer: BC Managed Care – PPO | Admitting: Licensed Clinical Social Worker

## 2021-02-10 ENCOUNTER — Ambulatory Visit: Payer: BC Managed Care – PPO | Admitting: Psychology

## 2021-02-10 ENCOUNTER — Other Ambulatory Visit (HOSPITAL_COMMUNITY): Payer: BC Managed Care – PPO

## 2021-02-10 DIAGNOSIS — F332 Major depressive disorder, recurrent severe without psychotic features: Secondary | ICD-10-CM

## 2021-02-11 ENCOUNTER — Other Ambulatory Visit (HOSPITAL_COMMUNITY): Payer: BC Managed Care – PPO | Admitting: Licensed Clinical Social Worker

## 2021-02-11 ENCOUNTER — Other Ambulatory Visit (HOSPITAL_COMMUNITY): Payer: BC Managed Care – PPO

## 2021-02-11 ENCOUNTER — Other Ambulatory Visit: Payer: Self-pay

## 2021-02-11 DIAGNOSIS — R4589 Other symptoms and signs involving emotional state: Secondary | ICD-10-CM

## 2021-02-11 DIAGNOSIS — F32A Depression, unspecified: Secondary | ICD-10-CM | POA: Diagnosis not present

## 2021-02-11 DIAGNOSIS — F332 Major depressive disorder, recurrent severe without psychotic features: Secondary | ICD-10-CM

## 2021-02-12 ENCOUNTER — Telehealth (HOSPITAL_COMMUNITY): Payer: Self-pay | Admitting: Licensed Clinical Social Worker

## 2021-02-12 ENCOUNTER — Other Ambulatory Visit (HOSPITAL_COMMUNITY): Payer: BC Managed Care – PPO

## 2021-02-12 ENCOUNTER — Other Ambulatory Visit: Payer: Self-pay

## 2021-02-15 ENCOUNTER — Other Ambulatory Visit (HOSPITAL_COMMUNITY): Payer: BC Managed Care – PPO

## 2021-02-15 ENCOUNTER — Telehealth (HOSPITAL_COMMUNITY): Payer: Self-pay | Admitting: Licensed Clinical Social Worker

## 2021-02-15 ENCOUNTER — Other Ambulatory Visit: Payer: Self-pay

## 2021-02-16 ENCOUNTER — Other Ambulatory Visit (HOSPITAL_COMMUNITY): Payer: BC Managed Care – PPO

## 2021-02-16 ENCOUNTER — Other Ambulatory Visit: Payer: Self-pay

## 2021-02-17 ENCOUNTER — Other Ambulatory Visit: Payer: Self-pay

## 2021-02-17 ENCOUNTER — Other Ambulatory Visit (HOSPITAL_COMMUNITY): Payer: BC Managed Care – PPO

## 2021-02-17 ENCOUNTER — Telehealth (HOSPITAL_COMMUNITY): Payer: Self-pay | Admitting: Licensed Clinical Social Worker

## 2021-02-17 ENCOUNTER — Ambulatory Visit: Payer: BC Managed Care – PPO | Admitting: Psychology

## 2021-02-18 ENCOUNTER — Other Ambulatory Visit (HOSPITAL_COMMUNITY): Payer: BC Managed Care – PPO

## 2021-02-18 ENCOUNTER — Other Ambulatory Visit: Payer: Self-pay

## 2021-02-19 ENCOUNTER — Ambulatory Visit (HOSPITAL_COMMUNITY): Payer: BC Managed Care – PPO

## 2021-02-19 ENCOUNTER — Other Ambulatory Visit (HOSPITAL_COMMUNITY): Payer: BC Managed Care – PPO

## 2021-02-19 ENCOUNTER — Encounter (HOSPITAL_COMMUNITY): Payer: Self-pay | Admitting: Family

## 2021-02-19 NOTE — Progress Notes (Signed)
  [facility] Behavioral Health Partial Hospitalization  Outpatient Program Discharge Summary  Kristen Ellison [id number]  Admission date: 02/09/2021 Discharge date: 02/19/2021  Reason for admission: per admission assessment note: Kristen Ellison is a 25 y.o. female presents with depression related to grief and loss.  She reports the passing of her father this past February.  Currently she is denying suicidal homicidal ideations.  Denies auditory or visual hallucinations.  Patient reports she is currently followed by her primary care which she is prescribed Wellbutrin 150 mg p.o. daily, BuSpar 15 mg p.o. 3 times daily daily Celexa 20 mg p.o. daily hydroxyzine 50 mg p.o. 3 times daily as needed which she reports taking and tolerating medications well.  States she is followed by therapist Jeani Sow.  She denied previous inpatient admissions.  Reports a history of self injures behaviors.  States she cut recently 2 weeks prior denies it was a suicide attempt.  Denied history of physical sexual abuse.  Denies illicit drug use.  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  patient was enrolled in partial psychiatric program on 02/09/21.  Progress in Program Toward Treatment Goals: Ongoing, Shatiqua Heroux attended and participated with a few group sessions.  It was reported patient has requested to discharge.  Patient to keep follow-up appointment with all outpatient providers.  Continue medications as directed    Take all medications as prescribed. Keep all follow-up appointments as scheduled.  Do not consume alcohol or use illegal drugs while on prescription medications. Report any adverse effects from your medications to your primary care provider promptly.  In the event of recurrent symptoms or worsening symptoms, call 911, a crisis hotline, or go to the nearest emergency department for evaluation.    Oneta Rack, NP 02/19/2021

## 2021-02-22 ENCOUNTER — Ambulatory Visit (HOSPITAL_COMMUNITY): Payer: BC Managed Care – PPO

## 2021-02-22 ENCOUNTER — Other Ambulatory Visit (HOSPITAL_COMMUNITY): Payer: BC Managed Care – PPO

## 2021-02-23 ENCOUNTER — Other Ambulatory Visit (HOSPITAL_COMMUNITY): Payer: BC Managed Care – PPO

## 2021-02-23 ENCOUNTER — Ambulatory Visit (HOSPITAL_COMMUNITY): Payer: BC Managed Care – PPO

## 2021-02-24 ENCOUNTER — Ambulatory Visit (HOSPITAL_COMMUNITY): Payer: BC Managed Care – PPO

## 2021-02-24 ENCOUNTER — Other Ambulatory Visit (HOSPITAL_COMMUNITY): Payer: BC Managed Care – PPO

## 2021-02-25 ENCOUNTER — Other Ambulatory Visit (HOSPITAL_COMMUNITY): Payer: BC Managed Care – PPO

## 2021-02-25 ENCOUNTER — Ambulatory Visit (HOSPITAL_COMMUNITY): Payer: BC Managed Care – PPO

## 2021-02-26 ENCOUNTER — Other Ambulatory Visit (HOSPITAL_COMMUNITY): Payer: BC Managed Care – PPO

## 2021-02-26 ENCOUNTER — Ambulatory Visit (HOSPITAL_COMMUNITY): Payer: BC Managed Care – PPO

## 2021-03-18 NOTE — Psych (Signed)
Virtual Visit via Video Note  I connected with Kristen Ellison on 02/08/21 at  9:00 AM EDT by a video enabled telemedicine application and verified that I am speaking with the correct person using two identifiers.  Location: Patient: patient home Provider: clinical home office   I discussed the limitations of evaluation and management by telemedicine and the availability of in person appointments. The patient expressed understanding and agreed to proceed.   I discussed the assessment and treatment plan with the patient. The patient was provided an opportunity to ask questions and all were answered. The patient agreed with the plan and demonstrated an understanding of the instructions.   The patient was advised to call back or seek an in-person evaluation if the symptoms worsen or if the condition fails to improve as anticipated.  Cln and pt completed treatment plan and pt stated verbal alignment with plan. Pt gave verbal consent to treatment and virtual treatment, agreement with group commitments, and permission to release information for purposes of any requested paperwork.    I provided 10 minutes of non-face-to-face time during this encounter.   Donia Guiles, LCSW

## 2021-03-18 NOTE — Psych (Signed)
Virtual Visit via Video Note  I connected with Kristen Ellison on 02/08/21 at  9:00 AM EDT by a video enabled telemedicine application and verified that I am speaking with the correct person using two identifiers.  Location: Patient: patient home Provider: clinical home office   I discussed the limitations of evaluation and management by telemedicine and the availability of in person appointments. The patient expressed understanding and agreed to proceed.  I discussed the assessment and treatment plan with the patient. The patient was provided an opportunity to ask questions and all were answered. The patient agreed with the plan and demonstrated an understanding of the instructions.   The patient was advised to call back or seek an in-person evaluation if the symptoms worsen or if the condition fails to improve as anticipated.  Pt was provided 240 minutes of non-face-to-face time during this encounter.   Donia Guiles, LCSW    [facility] PHP THERAPIST PROGRESS NOTE  Kristen Ellison [id number]  Session Time: 9:00 - 10:00  Participation Level: Active  Behavioral Response: CasualAlertDepressed  Type of Therapy: Group Therapy  Treatment Goals addressed: Coping  Interventions: CBT, DBT, Supportive, and Reframing  Summary: Clinician led check-in regarding current stressors and situation. Clinician utilized active listening and empathetic response and validated patient emotions. Clinician facilitated processing group on pertinent issues.   Therapist Response: Kristen Ellison is a 25 y.o. female who presents with depression symptoms. Patient arrived within time allowed and reports that she is feeling "okay." Patient rates her mood at a 7 on a scale of 1-10 with 10 being great. Pt reports her weekend was "a little difficult" and she struggled with memories of her dad. Pt reports struggling with downtime. Pt able to process. Pt engaged in discussion.         Session  Time: 10:00 - 11:00   Participation Level: Active   Behavioral Response: CasualAlertDepressed   Type of Therapy: Group Therapy   Treatment Goals addressed: Coping   Interventions: CBT, DBT, Supportive and Reframing   Summary: Cln led processing group for pt's current struggles. Group members shared stressors and provided support and feedback. Cln brought in topics of boundaries, healthy relationships, and unhealthy thought processes to inform discussion.      Therapist Response: Pt able to process and provide support to group.           Session Time: 11:00 - 12:00   Participation Level: Active   Behavioral Response: CasualAlertDepressed   Type of Therapy: Group Therapy   Treatment Goals addressed: Coping   Interventions: CBT, DBT, Supportive and Reframing   Summary: Cln led discussion on setting boundaries as a way to increase self-care. Group members discussed things that are stumbling blocks to them engaging in self-care and worked to determine what boundary could address that stumbling block. Group worked together to determine how to address the boundary. Cln brought in topics of boundaries, assertiveness, thought challenging, and self-care.      Therapist Response: Pt engaged in discussion and identifies a boundary that can improve their self-care.            Session Time: 12:00 -1:00   Participation Level: Active   Behavioral Response: CasualAlertDepressed   Type of Therapy: Group therapy   Treatment Goals addressed: Coping   Interventions: Psychosocial skills training, Supportive   Summary: 12:00 - 12:50: Occupational Therapy group 12:50 -1:00 Clinician led check-out. Clinician assessed for immediate needs, medication compliance and efficacy, and safety concerns   Therapist Response:  12:00 - 12:50: Patient engaged in group. See OT note.  12:50 - 1:00: At check-out, patient rates her mood at a 10 on a scale of 1-10 with 10 being great. Pt reports afternoon  plans of rrelaxing. Pt demonstrates some progress as evidenced by participating in first group session. Patient denies SI/HI at the end of group.    Suicidal/Homicidal: Nowithout intent/plan  Plan: Pt will continue in PHP while working to decrease depression and grief symptoms, increase healthy sleep, and increase ability to manage symptoms in a healthy manner.  Diagnosis: Severe episode of recurrent major depressive disorder, without psychotic features (HCC) [F33.2]    1. Severe episode of recurrent major depressive disorder, without psychotic features (HCC)       Donia Guiles, LCSW 03/18/2021

## 2021-03-26 NOTE — Psych (Signed)
Virtual Visit via Video Note  I connected with Keishla Oyer on 02/11/21 at 9:00 AM EDT by a video enabled telemedicine application and verified that I am speaking with the correct person using two identifiers.  Location: Patient: patient home Provider: clinical home office   I discussed the limitations of evaluation and management by telemedicine and the availability of in person appointments. The patient expressed understanding and agreed to proceed.  I discussed the assessment and treatment plan with the patient. The patient was provided an opportunity to ask questions and all were answered. The patient agreed with the plan and demonstrated an understanding of the instructions.   The patient was advised to call back or seek an in-person evaluation if the symptoms worsen or if the condition fails to improve as anticipated.  Pt was provided 240 minutes of non-face-to-face time during this encounter.   Donia Guiles, LCSW    [facility] PHP THERAPIST PROGRESS NOTE  Kamorie Aldous [id number]  Session Time: 9:00 - 10:00  Participation Level: Active  Behavioral Response: CasualAlertDepressed  Type of Therapy: Group Therapy  Treatment Goals addressed: Coping  Interventions: CBT, DBT, Supportive, and Reframing  Summary: Clinician led check-in regarding current stressors and situation. Clinician utilized active listening and empathetic response and validated patient emotions. Clinician facilitated processing group on pertinent issues.   Therapist Response: Marguerette Sheller is a 25 y.o. female who presents with depression symptoms. Patient arrived within time allowed and reports that she is feeling "good." Patient rates her mood at a 9 on a scale of 1-10 with 10 being great. Pt reports yesterday was a struggle emotionally and her grief was high. Pt states getting support from her system. Pt reports struggling with being hard on herself. Pt able to process. Pt engaged in  discussion.         Session Time: 10:00 - 11:00   Participation Level: Active   Behavioral Response: CasualAlertDepressed   Type of Therapy: Group Therapy   Treatment Goals addressed: Coping   Interventions: CBT, DBT, Supportive and Reframing   Summary: Cln led discussion on healthy relationships. Group members shared issues they have experienced in past relationships and in identifying what "healthy" looks like. Cln discussed respect, trust, and honesty as non-negotiable traits in a healthy dynamic. Group shared and problem solved barriers to recognizing and priortizing these traits.      Therapist Response: Pt engaged in discussion and is able to process.           Session Time: 11:00- 12:00   Participation Level: Active   Behavioral Response: CasualAlertDepressed   Type of Therapy: Group Therapy   Treatment Goals addressed: Coping   Interventions: CBT, DBT, Supportive and Reframing   Summary: Cln introduced topic of CBT cognitive distortions. Cln discussed unhealthy thought patterns and how our thoughts shape our reality and irrational thoughts can alter our perspective. Cln utilized handout "cognitive distortions" to discuss common examples of distorted thoughts and group members worked to identify examples in their own life.      Therapist Response: Pt engaged in discussion and is able to determine examples of distorted thinking in their own life.         Session Time: 12:00 -1:00   Participation Level: Active   Behavioral Response: CasualAlertDepressed   Type of Therapy: Group therapy   Treatment Goals addressed: Coping   Interventions: CBT; Solution focused; Supportive; Reframing   Summary: 12:00 - 12:50: Cln continued topic of CBT cognitive distortions and utilized handout "  Unhealthy Thought Patterns"to review common examples of distorted thought to increase awareness of the distorted thoughts. 12:50 -1:00 Clinician led check-out. Clinician assessed for  immediate needs, medication compliance and efficacy, and safety concerns   Therapist Response: 12:50 - 1:00: Pt engaged in discussion and is able to make connections.  12:50 - 1:00: At check-out, patient rates her mood at a 10 on a scale of 1-10 with 10 being great. Pt reports afternoon plans of going to class. Pt demonstrates some progress as evidenced by utilizing supports. Patient denies SI/HI at the end of group.    Suicidal/Homicidal: Nowithout intent/plan  Plan: Pt will continue in PHP while working to decrease depression and grief symptoms, increase healthy sleep, and increase ability to manage symptoms in a healthy manner.  Diagnosis: Severe episode of recurrent major depressive disorder, without psychotic features (HCC) [F33.2]    1. Severe episode of recurrent major depressive disorder, without psychotic features (HCC)   2. Difficulty coping       Donia Guiles, LCSW 03/26/2021

## 2021-03-26 NOTE — Psych (Signed)
Virtual Visit via Video Ellison  I connected with Kristen Ellison on 02/10/21 at 9:00 AM EDT by a video enabled telemedicine application and verified that I am speaking with the correct person using two identifiers.  Location: Patient: patient home Provider: clinical home office   I discussed the limitations of evaluation and management by telemedicine and the availability of in person appointments. The patient expressed understanding and agreed to proceed.  I discussed the assessment and treatment plan with the patient. The patient was provided an opportunity to ask questions and all were answered. The patient agreed with the plan and demonstrated an understanding of the instructions.   The patient was advised to call back or seek an in-person evaluation if the symptoms worsen or if the condition fails to improve as anticipated.  Pt was provided 150 minutes of non-face-to-face time during this encounter.   Kristen Guiles, LCSW    Kristen Ellison  Kristen Ellison [id number]  Session Time: 9:00 - 10:00  Participation Level: Active  Behavioral Response: CasualAlertDepressed  Type of Therapy: Group Therapy  Treatment Goals addressed: Coping  Interventions: CBT, DBT, Supportive, and Reframing  Summary: Clinician led check-in regarding current stressors and situation. Clinician utilized active listening and empathetic response and validated patient emotions. Clinician facilitated processing group on pertinent issues.   Therapist Response: Kristen Ellison is a 25 y.o. female who presents with depression symptoms. Patient arrived within time allowed and reports that she is feeling "emotional." Patient rates her mood at a 4 on a scale of 1-10 with 10 being great. Pt reports struggling with grief yesterday and into today due to getting back into a "normal routine" and the changes this year. Pt reports sleeping poorly. Pt able to process. Pt engaged in  discussion.         Session Time: 10:00 - 11:00   Participation Level: Active   Behavioral Response: CasualAlertDepressed   Type of Therapy: Group Therapy   Treatment Goals addressed: Coping   Interventions: CBT, DBT, Supportive and Reframing   Summary: Cln led processing group for pt's current struggles. Group members shared stressors and provided support and feedback. Cln brought in topics of boundaries, healthy relationships, and unhealthy thought processes to inform discussion.      Therapist Response: Pt able to process and provide support to group.           Session Time: 11:00 - 12:00   Participation Level: Active   Behavioral Response: CasualAlertDepressed   Type of Therapy: Group Therapy   Treatment Goals addressed: Coping   Interventions: CBT, DBT, Supportive and Reframing   Summary: Nutrition group with dietician K. Watts. Group discussed how nutrition can support our mental health.      Therapist Response: Pt engaged in discussion.   **Pt chose to leave at 11:30 due to a conflicting class.     Suicidal/Homicidal: Nowithout intent/plan  Plan: Pt will continue in PHP while working to decrease depression and grief symptoms, increase healthy sleep, and increase ability to manage symptoms in a healthy manner.  Diagnosis: Severe episode of recurrent major depressive disorder, without psychotic features (HCC) [F33.2]    1. Severe episode of recurrent major depressive disorder, without psychotic features (HCC)       Kristen Guiles, LCSW 03/26/2021

## 2021-03-26 NOTE — Psych (Signed)
Virtual Visit via Video Note  I connected with Kristen Ellison on 02/09/21 at 9:00 AM EDT by a video enabled telemedicine application and verified that I am speaking with the correct person using two identifiers.  Location: Patient: patient home Provider: clinical home office   I discussed the limitations of evaluation and management by telemedicine and the availability of in person appointments. The patient expressed understanding and agreed to proceed.  I discussed the assessment and treatment plan with the patient. The patient was provided an opportunity to ask questions and all were answered. The patient agreed with the plan and demonstrated an understanding of the instructions.   The patient was advised to call back or seek an in-person evaluation if the symptoms worsen or if the condition fails to improve as anticipated.  Pt was provided 240 minutes of non-face-to-face time during this encounter.   Kristen Guiles, LCSW    [facility] PHP THERAPIST PROGRESS NOTE  Kristen Ellison [id number]  Session Time: 9:00 - 10:00  Participation Level: Active  Behavioral Response: CasualAlertDepressed  Type of Therapy: Group Therapy  Treatment Goals addressed: Coping  Interventions: CBT, DBT, Supportive, and Reframing  Summary: Clinician led check-in regarding current stressors and situation. Clinician utilized active listening and empathetic response and validated patient emotions. Clinician facilitated processing group on pertinent issues.   Therapist Response: Kristen Ellison is a 25 y.o. female who presents with depression symptoms. Patient arrived within time allowed and reports that she is feeling "good." Patient rates her mood at a 9 on a scale of 1-10 with 10 being great. Pt reports feeling good after group and receiving support yesterday. Pt states her first day of classes went well however she struggled with grief because this is the first semester her dad is not a  part of. Pt able to process. Pt engaged in discussion.         Session Time: 10:00 - 11:00   Participation Level: Active   Behavioral Response: CasualAlertDepressed   Type of Therapy: Group Therapy   Treatment Goals addressed: Coping   Interventions: CBT, DBT, Supportive and Reframing   Summary: Cln led processing group for pt's current struggles. Group members shared stressors and provided support and feedback. Cln brought in topics of boundaries, healthy relationships, and unhealthy thought processes to inform discussion.      Therapist Response: Pt able to process and provide support to group.           Session Time: 11:00 - 12:00   Participation Level: Active   Behavioral Response: CasualAlertDepressed   Type of Therapy: Group Therapy   Treatment Goals addressed: Coping   Interventions: CBT, DBT, Supportive and Reframing   Summary: Cln led discussion on setting boundaries as a way to increase self-care. Group members discussed things that are stumbling blocks to them engaging in self-care and worked to determine what boundary could address that stumbling block. Group worked together to determine how to address the boundary. Cln brought in topics of boundaries, assertiveness, thought challenging, and self-care.      Therapist Response: Pt engaged in discussion and identifies a boundary that can improve their self-care.            Session Time: 12:00 -1:00   Participation Level: Active   Behavioral Response: CasualAlertDepressed   Type of Therapy: Group therapy   Treatment Goals addressed: Coping   Interventions: Psychosocial skills training, Supportive   Summary: 12:00 - 12:50: Occupational Therapy group 12:50 -1:00 Clinician led check-out. Clinician  assessed for immediate needs, medication compliance and efficacy, and safety concerns   Therapist Response: 12:00 - 12:50: Patient engaged in group. See OT note.  12:50 - 1:00: At check-out, patient rates  her mood at a 10 on a scale of 1-10 with 10 being great. Pt reports afternoon plans of going to  medical appt. Pt demonstrates some progress as evidenced by improved sleep. Patient denies SI/HI at the end of group.    Suicidal/Homicidal: Nowithout intent/plan  Plan: Pt will continue in PHP while working to decrease depression and grief symptoms, increase healthy sleep, and increase ability to manage symptoms in a healthy manner.  Diagnosis: Severe episode of recurrent major depressive disorder, without psychotic features (HCC) [F33.2]    1. Severe episode of recurrent major depressive disorder, without psychotic features (HCC)   2. Difficulty coping       Kristen Guiles, LCSW 03/26/2021

## 2021-04-27 ENCOUNTER — Telehealth: Payer: Self-pay | Admitting: Family

## 2021-04-27 ENCOUNTER — Encounter (HOSPITAL_COMMUNITY): Payer: Self-pay | Admitting: Emergency Medicine

## 2021-04-27 ENCOUNTER — Ambulatory Visit (HOSPITAL_COMMUNITY)
Admission: EM | Admit: 2021-04-27 | Discharge: 2021-04-27 | Disposition: A | Discharge status: Home / Self Care | Payer: BC Managed Care – PPO | Attending: Family Medicine | Admitting: Family Medicine

## 2021-04-27 ENCOUNTER — Other Ambulatory Visit: Payer: Self-pay

## 2021-04-27 DIAGNOSIS — J40 Bronchitis, not specified as acute or chronic: Secondary | ICD-10-CM

## 2021-04-27 DIAGNOSIS — J069 Acute upper respiratory infection, unspecified: Secondary | ICD-10-CM | POA: Diagnosis not present

## 2021-04-27 DIAGNOSIS — J4521 Mild intermittent asthma with (acute) exacerbation: Secondary | ICD-10-CM

## 2021-04-27 MED ORDER — PROMETHAZINE-DM 6.25-15 MG/5ML PO SYRP: 5.0000 mL | ORAL_SOLUTION | Freq: Four times a day (QID) | ORAL | 0 refills | Status: DC | PRN

## 2021-04-27 MED ORDER — PREDNISONE 20 MG PO TABS: 40.0000 mg | ORAL_TABLET | Freq: Every day | ORAL | 0 refills | Status: DC

## 2021-04-27 NOTE — Telephone Encounter (Signed)
Patient currently in [facility] for evaluation/treatment.   [facility] Day - Client TELEPHONE ADVICE RECORD AccessNurse Patient Name:Kristen Ellison Gender: Female DOB: 04-28-1996 Age: 25 Y 1 M 2 D Return Phone Number:[phone](Primary),[phone](Secondary) [facility] - Client Client Site [facility] - Day Physician Ria Clock- NP Contact Type Call Who Is Calling Patient / Member / Family / Caregiver Call Type Triage / Clinical Caller Name [name] Relationship To Patient Mother Return Phone Number [phone] (Secondary) Chief Complaint Coughing Up Blood Reason for Call Symptomatic / Request for Health Information Initial Comment Caller states her daughter is very sick with a headache,sore throat,fever and congestion. Caller states her daughter was coughing blood this morning. GOTO Facility Not Listed [facility] in [place] Translation No Nurse Assessment Nurse: Kizzie Bane, RN, Marylene Land Date/Time [name] Time): 04/27/2021 8:57:53 AM Confirm and document reason for call. If symptomatic, describe symptoms. ---Caller states she has a sore throat and headache with congestion and fever. Symptoms started Thursday. Temp was 101.6 earlier Does the patient have any new or worsening symptoms? ---Yes Will a triage be completed? ---Yes Related visit to physician within the last 2 weeks? ---No Does the PT have any chronic conditions? (i.e. diabetes, asthma, this includes High risk factors for pregnancy, etc.) ---No Is the patient pregnant or possibly pregnant? (Ask all females between the ages of 1-55) ---No Is this a behavioral health or substance abuse call? ---No Guidelines Guideline Title Affirmed Question Affirmed Notes Nurse Date/Time [name] Time) Sore Throat [1] Difficulty breathing AND [2] not severe Leanord Hawking 04/27/2021 8:59:43 AM PLEASE NOTE: All timestamps contained within  this report are represented as [place] Standard Time. CONFIDENTIALTY NOTICE: This fax transmission is intended only for the addressee. It contains information that is legally privileged, confidential or otherwise protected from use or disclosure. If you are not the intended recipient, you are strictly prohibited from reviewing, disclosing, copying using or disseminating any of this information or taking any action in reliance on or regarding this information. If you have received this fax in error, please notify us immediately by telephone so that we can arrange for its return to [place]. Phone: [phone], Toll-Free: [phone], Fax: [phone] Page: 2 of 2 Call Id: [id number] Disp. Time [name] Time) Disposition Final User 04/27/2021 8:46:29 AM Send To RN Personal Lane Hacker, RN, Windy 04/27/2021 9:02:39 AM Go to ED Now (or PCP triage) Yes Kizzie Bane, RN, Rosalyn Charters Disagree/Comply Comply Caller Understands Yes PreDisposition Did not know what to do Care Advice Given Per Guideline GO TO ED NOW (OR PCP TRIAGE): * IF NO PCP (PRIMARY CARE PROVIDER) SECOND-LEVEL TRIAGE: You need to be seen within the next hour. Go to the ED/UCC at _____________ Hospital. Leave as soon as you can. CARE ADVICE given per Sore Throat (Adult) guideline. Referrals GO TO FACILITY OTHER - SPECIF
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
## 2021-04-27 NOTE — Telephone Encounter (Signed)
Pt. Mother called and stated she has been sick since Thursday, body aches, chills, really bad fever. She tested her for covid and it was negative. Pt mother states pt woke up this morning coughing up blood. Transferred to triage for further advice.

## 2021-04-27 NOTE — ED Triage Notes (Signed)
Pt is present today with cough, fever, hot sweats, sore throat ,and nasal congestion. Pt states sx started Thursday

## 2021-05-19 ENCOUNTER — Other Ambulatory Visit: Payer: Self-pay

## 2021-05-19 ENCOUNTER — Emergency Department
Admission: RE | Admit: 2021-05-19 | Discharge: 2021-05-19 | Disposition: A | Discharge status: Home / Self Care | Payer: BC Managed Care – PPO | Source: Ambulatory Visit

## 2021-05-19 VITALS — BP 160/88 | HR 125 | Temp 100.7°F | Resp 20

## 2021-05-19 DIAGNOSIS — J111 Influenza due to unidentified influenza virus with other respiratory manifestations: Secondary | ICD-10-CM | POA: Diagnosis not present

## 2021-05-19 DIAGNOSIS — R059 Cough, unspecified: Secondary | ICD-10-CM

## 2021-05-19 DIAGNOSIS — R509 Fever, unspecified: Secondary | ICD-10-CM | POA: Diagnosis not present

## 2021-05-19 LAB — POCT INFLUENZA A/B
Influenza A, POC: NEGATIVE
Influenza B, POC: NEGATIVE

## 2021-05-19 MED ORDER — ACETAMINOPHEN 500 MG PO TABS
1000.0000 mg | ORAL_TABLET | ORAL | Status: AC
  Administered 2021-05-19: 1000 mg via ORAL

## 2021-05-19 MED ORDER — OSELTAMIVIR PHOSPHATE 75 MG PO CAPS: 75.0000 mg | ORAL_CAPSULE | Freq: Two times a day (BID) | ORAL | 0 refills | Status: DC

## 2021-05-19 MED ORDER — PREDNISONE 20 MG PO TABS: ORAL_TABLET | ORAL | 0 refills | Status: DC

## 2021-05-19 MED ORDER — BENZONATATE 200 MG PO CAPS: 200.0000 mg | ORAL_CAPSULE | Freq: Three times a day (TID) | ORAL | 0 refills | Status: AC | PRN

## 2021-05-19 NOTE — ED Triage Notes (Signed)
Patient presents to Urgent Care with complaints of nasal congestion, fever since 2 days ago. Patient reports headache, cough, fever 100.15F-102 F and nasal congestion. Taking Mucinex, Promethazine cough syrup from last visit. Not sure if any sick contacts
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
## 2021-05-19 NOTE — Discharge Instructions (Addendum)
Advised patient may alternate OTC Tylenol 1000 mg 1-2 times daily, as needed with OTC Ibuprofen 800 mg 1-2 times daily, as needed for fever and myalgias.  Advised patient to take medication as directed with food to completion.  Advised patient may take Tessalon Perles daily or as needed for cough.  Encouraged patient to increase daily water intake while taking these medications.

## 2021-08-19 ENCOUNTER — Telehealth: Payer: Self-pay

## 2021-08-19 NOTE — Telephone Encounter (Signed)
Nurse Assessment Nurse: Lucky Cowboy, RN, Levada Dy Date/Time (Eastern Time): 08/19/2021 11:45:30 AM Confirm and document reason for call. If symptomatic, describe symptoms. ---Caller stated that her BP was 187/103 this morning. She has a cough. She denies any chest pain, shortness of breath. She has a headache. She is not taking any meds currently; she was on--HCTZ, Losartan, Buproprion, inhalers, Hydroxyzine. She forgot to get her refills and wants to talk to the doctor about them. Does the patient have any new or worsening symptoms? ---Yes Will a triage be completed? ---Yes Related visit to physician within the last 2 weeks? ---No Does the PT have any chronic conditions? (i.e. diabetes, asthma, this includes High risk factors for pregnancy, etc.) ---Yes List chronic conditions. ---HTN, asthma, depression/anxiety, PCOS, obesity Is the patient pregnant or possibly pregnant? (Ask all females between the ages of 67-55) ---No Is this a behavioral health or substance abuse call? ---No Guidelines Guideline Title Affirmed Question Affirmed Notes Nurse Date/Time (Eastern Time) Headache Loss of vision or double vision (Exception: same as prior migraines) Dew, RN, Levada Dy 08/19/2021 11:49:31 AM PLEASE NOTE: All timestamps contained within this report are represented as [place] Standard Time. CONFIDENTIALTY NOTICE: This fax transmission is intended only for the addressee. It contains information that is legally privileged, confidential or otherwise protected from use or disclosure. If you are not the intended recipient, you are strictly prohibited from reviewing, disclosing, copying using or disseminating any of this information or taking any action in reliance on or regarding this information. If you have received this fax in error, please notify us immediately by telephone so that we can arrange for its return to [place]. Phone: [phone], Toll-Free: [phone], Fax: [phone] Page: 2 of 2 Call Id:  [id number] [place]. Time Eilene Ghazi Time) Disposition Final User 08/19/2021 11:54:06 AM Go to ED Now (or PCP triage) Yes Dew, RN, Marin Shutter Disagree/Comply Comply Caller Understands Yes PreDisposition Call Doctor Care Advice Given Per Guideline GO TO ED NOW (OR PCP TRIAGE): * IF NO PCP (PRIMARY CARE PROVIDER) SECOND-LEVEL TRIAGE: You need to be seen within the next hour. Go to the [facility] at _____________ [place] as soon as you can. ANOTHER ADULT SHOULD DRIVE: * It is better and safer if another adult drives instead of you. CARE ADVICE given per Headache (Adult) guideline. Comments User: Raford Pitcher, RN Date/Time Eilene Ghazi Time): 08/19/2021 11:55:12 AM Caller stated that she is going to [facility] ER. User: Raford Pitcher, RN Date/Time Eilene Ghazi Time): 08/19/2021 11:56:17 AM Ins caller on why we need to have our BP controlled because she's already having s/s intermittently that hers is not controlled--occ blurry or loss of vision, left arm will tingle sometimes, headaches, and shortness of breath when lying down flat. Referrals REFERRED TO PCP OFFICE

## 2021-08-20 NOTE — Telephone Encounter (Signed)
I have called the pt to gather more information. She has stopped all her medication because she never picked it up. She is not about to pick up her HCTZ and Losartan and restart that medication per provider advise. She did not go to the ED yeserday, but did take an Aspirn and that has helped her. She feels well today and if she gets any additional high readings or headaches then she will go to urgent care.

## 2021-08-24 ENCOUNTER — Ambulatory Visit: Payer: BC Managed Care – PPO | Admitting: Family

## 2021-08-24 ENCOUNTER — Ambulatory Visit: Payer: BC Managed Care – PPO | Admitting: Family Medicine

## 2021-08-26 ENCOUNTER — Ambulatory Visit: Payer: BC Managed Care – PPO | Admitting: Family

## 2021-08-26 VITALS — BP 170/84 | HR 106 | Temp 98.7°F | Ht 67.0 in | Wt >= 6400 oz

## 2021-08-26 DIAGNOSIS — I10 Essential (primary) hypertension: Secondary | ICD-10-CM | POA: Diagnosis not present

## 2021-08-26 DIAGNOSIS — F411 Generalized anxiety disorder: Secondary | ICD-10-CM

## 2021-08-26 DIAGNOSIS — F339 Major depressive disorder, recurrent, unspecified: Secondary | ICD-10-CM

## 2021-08-26 LAB — CBC WITH DIFFERENTIAL/PLATELET
Basophils Absolute: 0 10*3/uL (ref 0.0–0.1)
Basophils Relative: 0.5 % (ref 0.0–3.0)
Eosinophils Absolute: 0.2 10*3/uL (ref 0.0–0.7)
Eosinophils Relative: 2.2 % (ref 0.0–5.0)
HCT: 38.4 % (ref 36.0–46.0)
Hemoglobin: 12.6 g/dL (ref 12.0–15.0)
Lymphocytes Relative: 25.7 % (ref 12.0–46.0)
Lymphs Abs: 2.7 10*3/uL (ref 0.7–4.0)
MCHC: 32.8 g/dL (ref 30.0–36.0)
MCV: 79.1 fl (ref 78.0–100.0)
Monocytes Absolute: 0.6 10*3/uL (ref 0.1–1.0)
Monocytes Relative: 6.3 % (ref 3.0–12.0)
Neutro Abs: 6.7 10*3/uL (ref 1.4–7.7)
Neutrophils Relative %: 65.3 % (ref 43.0–77.0)
Platelets: 380 10*3/uL (ref 150.0–400.0)
RBC: 4.85 Mil/uL (ref 3.87–5.11)
RDW: 15.7 % — ABNORMAL HIGH (ref 11.5–15.5)
WBC: 10.3 10*3/uL (ref 4.0–10.5)

## 2021-08-26 LAB — COMPREHENSIVE METABOLIC PANEL
ALT: 17 U/L (ref 0–35)
AST: 11 U/L (ref 0–37)
Albumin: 4.1 g/dL (ref 3.5–5.2)
Alkaline Phosphatase: 82 U/L (ref 39–117)
BUN: 11 mg/dL (ref 6–23)
CO2: 31 mEq/L (ref 19–32)
Calcium: 9.8 mg/dL (ref 8.4–10.5)
Chloride: 95 mEq/L — ABNORMAL LOW (ref 96–112)
Creatinine, Ser: 0.73 mg/dL (ref 0.40–1.20)
GFR: 115.92 mL/min (ref >60.00)
Glucose, Bld: 252 mg/dL — ABNORMAL HIGH (ref 70–99)
Potassium: 4.5 mEq/L (ref 3.5–5.1)
Sodium: 135 mEq/L (ref 135–145)
Total Bilirubin: 0.3 mg/dL (ref 0.2–1.2)
Total Protein: 8.1 g/dL (ref 6.0–8.3)

## 2021-08-26 MED ORDER — HYDROCHLOROTHIAZIDE 25 MG PO TABS: 25.0000 mg | ORAL_TABLET | Freq: Every day | ORAL | 3 refills | Status: DC

## 2021-08-26 MED ORDER — LOSARTAN POTASSIUM 100 MG PO TABS: 100.0000 mg | ORAL_TABLET | Freq: Every day | ORAL | 3 refills | Status: DC
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
## 2021-09-14 ENCOUNTER — Ambulatory Visit: Payer: BC Managed Care – PPO | Admitting: Family

## 2021-10-12 ENCOUNTER — Ambulatory Visit: Payer: BC Managed Care – PPO | Admitting: Family

## 2021-11-03 ENCOUNTER — Encounter: Payer: Self-pay | Admitting: Family

## 2021-11-19 ENCOUNTER — Encounter: Payer: Self-pay | Admitting: Family

## 2021-11-26 ENCOUNTER — Telehealth: Payer: Self-pay | Admitting: Family

## 2021-11-26 NOTE — Telephone Encounter (Signed)
There are forms for this patient on Kristen Ellison's desk. She will be sending an email address and the forms need to be scanned to her. Thank you!

## 2021-11-29 NOTE — Telephone Encounter (Signed)
Form has been emailed to patient.

## 2022-05-03 ENCOUNTER — Encounter: Payer: Self-pay | Admitting: Family

## 2022-05-03 ENCOUNTER — Ambulatory Visit (INDEPENDENT_AMBULATORY_CARE_PROVIDER_SITE_OTHER): Payer: BC Managed Care – PPO | Admitting: Family

## 2022-05-03 ENCOUNTER — Other Ambulatory Visit: Payer: Self-pay | Admitting: Family

## 2022-05-03 VITALS — BP 138/88 | HR 103 | Temp 98.3°F | Ht 67.0 in | Wt >= 6400 oz

## 2022-05-03 DIAGNOSIS — R197 Diarrhea, unspecified: Secondary | ICD-10-CM | POA: Diagnosis not present

## 2022-05-03 MED ORDER — AZITHROMYCIN 500 MG PO TABS: 500.0000 mg | ORAL_TABLET | Freq: Every day | ORAL | 0 refills | Status: DC

## 2022-05-03 NOTE — Patient Instructions (Signed)
Food Choices to Help Relieve Diarrhea, Adult Diarrhea can make you feel weak and cause you to become dehydrated. Dehydration is a condition in which there is not enough water or other fluids in the body. It is important to choose the right foods and drinks to: Relieve diarrhea. Replace lost fluids and nutrients. Prevent dehydration. What are tips for following this plan? Relieving diarrhea Avoid foods that make your diarrhea worse. These may include: Foods and drinks that are sweetened with high-fructose corn syrup, honey, or sweeteners such as xylitol, sorbitol, and mannitol. Check food labels for these ingredients. Fried, greasy, or spicy foods. Raw fruits and vegetables. Eat foods that are rich in probiotics. These include foods such as yogurt and fermented milk products. Probiotics can help increase healthy bacteria in your stomach and intestines (gastrointestinal or GI tract). This may help digestion and stop diarrhea. If you have lactose intolerance, avoid dairy products. These may make your diarrhea worse. Take medicine to help stop diarrhea only as told by your health care provider. Replacing nutrients  Eat bland, easy-to-digest foods in small amounts as you are able, until your diarrhea starts to get better. These foods include bananas, applesauce, rice, toast, and crackers. Over time, add nutrient-rich foods as your body tolerates them or as told by your health care provider. These include: Well-cooked protein foods, such as eggs, lean meats like fish or chicken without skin, and tofu. Peeled, seeded, and soft-cooked fruits and vegetables. Low-fat dairy products. Whole grains. Take vitamin and mineral supplements as told by your health care provider. Preventing dehydration  Start by sipping water or a solution to prevent dehydration (oral rehydration solution, or ORS). This is a drink that helps replace fluids and minerals your body has lost. You can buy an ORS at pharmacies and  retail stores. Try to drink at least 8-10 cups (2,000-2,500 mL) of fluid each day to help replace lost fluids. If your urine is pale yellow, you are getting enough fluids. You may drink other liquids in addition to water, such as fruit juice that you have added water to (diluted fruit juice) or low-calorie sports drinks, as tolerated or as told by your health care provider. Avoid drinks with caffeine, such as coffee, tea, or soft drinks. Avoid alcohol. This information is not intended to replace advice given to you by your health care provider. Make sure you discuss any questions you have with your health care provider. Document Revised: 11/30/2021 Document Reviewed: 11/30/2021 Elsevier Patient Education  2023 [employer]  
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
## 2022-05-03 NOTE — Addendum Note (Signed)
Addended by: Kem Boroughs D on: 05/03/2022 05:22 PM   Modules accepted: Orders

## 2022-05-04 ENCOUNTER — Other Ambulatory Visit: Payer: BC Managed Care – PPO

## 2022-05-04 ENCOUNTER — Telehealth: Payer: Self-pay | Admitting: Family

## 2022-05-04 DIAGNOSIS — R197 Diarrhea, unspecified: Secondary | ICD-10-CM

## 2022-05-04 LAB — CBC WITH DIFFERENTIAL/PLATELET
Basophils Absolute: 0.1 10*3/uL (ref 0.0–0.1)
Basophils Relative: 1 % (ref 0.0–3.0)
Eosinophils Absolute: 1.5 10*3/uL — ABNORMAL HIGH (ref 0.0–0.7)
Eosinophils Relative: 11.5 % — ABNORMAL HIGH (ref 0.0–5.0)
HCT: 36.6 % (ref 36.0–46.0)
Hemoglobin: 11.6 g/dL — ABNORMAL LOW (ref 12.0–15.0)
Lymphocytes Relative: 22.8 % (ref 12.0–46.0)
Lymphs Abs: 2.9 10*3/uL (ref 0.7–4.0)
MCHC: 31.7 g/dL (ref 30.0–36.0)
MCV: 77.8 fl — ABNORMAL LOW (ref 78.0–100.0)
Monocytes Absolute: 0.6 10*3/uL (ref 0.1–1.0)
Monocytes Relative: 4.6 % (ref 3.0–12.0)
Neutro Abs: 7.7 10*3/uL (ref 1.4–7.7)
Neutrophils Relative %: 60.1 % (ref 43.0–77.0)
Platelets: 434 10*3/uL — ABNORMAL HIGH (ref 150.0–400.0)
RBC: 4.71 Mil/uL (ref 3.87–5.11)
RDW: 16.2 % — ABNORMAL HIGH (ref 11.5–15.5)
WBC: 12.8 10*3/uL — ABNORMAL HIGH (ref 4.0–10.5)

## 2022-05-04 LAB — COMPREHENSIVE METABOLIC PANEL
ALT: 14 U/L (ref 0–35)
AST: 11 U/L (ref 0–37)
Albumin: 4 g/dL (ref 3.5–5.2)
Alkaline Phosphatase: 73 U/L (ref 39–117)
BUN: 8 mg/dL (ref 6–23)
CO2: 32 mEq/L (ref 19–32)
Calcium: 9.3 mg/dL (ref 8.4–10.5)
Chloride: 101 mEq/L (ref 96–112)
Creatinine, Ser: 0.66 mg/dL (ref 0.40–1.20)
GFR: 123.05 mL/min (ref >60.00)
Glucose, Bld: 125 mg/dL — ABNORMAL HIGH (ref 70–99)
Potassium: 4.9 mEq/L (ref 3.5–5.1)
Sodium: 139 mEq/L (ref 135–145)
Total Bilirubin: 0.2 mg/dL (ref 0.2–1.2)
Total Protein: 7.4 g/dL (ref 6.0–8.3)

## 2022-05-04 LAB — AMYLASE: Amylase: 19 U/L — ABNORMAL LOW (ref 27–131)

## 2022-05-04 LAB — LIPASE: Lipase: 25 U/L (ref 11.0–59.0)

## 2022-05-04 NOTE — Telephone Encounter (Signed)
Patient came to lab today and is showing redness in the face that started yesterday. Per Apolonio Schneiders, check with provider to see if this is a part of what is going/reason patient was seen in office yesterday or if patient should be seen again to evaluate. Please call to advise.

## 2022-05-04 NOTE — Telephone Encounter (Addendum)
She had not yet started it.  Sorry I did put that in there.  She was on her way to get it today. Spoke with her again and she stated her face was the same and no changes. I also advised that if she had any worsening symptoms to go to ER.

## 2022-05-06 ENCOUNTER — Ambulatory Visit: Payer: BC Managed Care – PPO | Admitting: Family

## 2022-05-06 ENCOUNTER — Other Ambulatory Visit: Payer: Self-pay | Admitting: Family

## 2022-05-06 DIAGNOSIS — R197 Diarrhea, unspecified: Secondary | ICD-10-CM

## 2022-05-06 LAB — GI PROFILE, STOOL, PCR

## 2022-08-05 ENCOUNTER — Ambulatory Visit: Payer: BC Managed Care – PPO | Admitting: Family

## 2022-08-05 ENCOUNTER — Other Ambulatory Visit: Payer: Self-pay | Admitting: Family

## 2022-08-05 ENCOUNTER — Encounter: Payer: Self-pay | Admitting: Family

## 2022-08-05 VITALS — BP 140/84 | HR 111 | Resp 18 | Ht 67.0 in | Wt >= 6400 oz

## 2022-08-05 DIAGNOSIS — Z6841 Body Mass Index (BMI) 40.0 and over, adult: Secondary | ICD-10-CM

## 2022-08-05 DIAGNOSIS — I10 Essential (primary) hypertension: Secondary | ICD-10-CM

## 2022-08-05 MED ORDER — VALSARTAN-HYDROCHLOROTHIAZIDE 160-12.5 MG PO TABS: 1.0000 | ORAL_TABLET | Freq: Every day | ORAL | 0 refills | Status: DC

## 2022-08-05 MED ORDER — ALBUTEROL SULFATE HFA 108 (90 BASE) MCG/ACT IN AERS: 2.0000 | INHALATION_SPRAY | Freq: Four times a day (QID) | RESPIRATORY_TRACT | 2 refills | Status: DC | PRN
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
## 2022-08-06 LAB — CBC WITH DIFFERENTIAL/PLATELET
Absolute Monocytes: 525 cells/uL (ref 200–950)
Basophils Absolute: 49 cells/uL (ref 0–200)
Basophils Relative: 0.4 %
Eosinophils Absolute: 329 cells/uL (ref 15–500)
Eosinophils Relative: 2.7 %
HCT: 37.8 % (ref 35.0–45.0)
Hemoglobin: 12.1 g/dL (ref 11.7–15.5)
Lymphs Abs: 3038 cells/uL (ref 850–3900)
MCH: 24.8 pg — ABNORMAL LOW (ref 27.0–33.0)
MCHC: 32 g/dL (ref 32.0–36.0)
MCV: 77.5 fL — ABNORMAL LOW (ref 80.0–100.0)
MPV: 9.7 fL (ref 7.5–12.5)
Monocytes Relative: 4.3 %
Neutro Abs: 8259 cells/uL — ABNORMAL HIGH (ref 1500–7800)
Neutrophils Relative %: 67.7 %
Platelets: 427 10*3/uL — ABNORMAL HIGH (ref 140–400)
RBC: 4.88 10*6/uL (ref 3.80–5.10)
RDW: 15.2 % — ABNORMAL HIGH (ref 11.0–15.0)
Total Lymphocyte: 24.9 %
WBC: 12.2 10*3/uL — ABNORMAL HIGH (ref 3.8–10.8)

## 2022-08-06 LAB — COMPREHENSIVE METABOLIC PANEL
AG Ratio: 1.1 (calc) (ref 1.0–2.5)
ALT: 15 U/L (ref 6–29)
AST: 14 U/L (ref 10–30)
Albumin: 3.8 g/dL (ref 3.6–5.1)
Alkaline phosphatase (APISO): 75 U/L (ref 31–125)
BUN: 9 mg/dL (ref 7–25)
CO2: 27 mmol/L (ref 20–32)
Calcium: 8.9 mg/dL (ref 8.6–10.2)
Chloride: 102 mmol/L (ref 98–110)
Creat: 0.79 mg/dL (ref 0.50–0.96)
Globulin: 3.4 g/dL (calc) (ref 1.9–3.7)
Glucose, Bld: 199 mg/dL — ABNORMAL HIGH (ref 65–99)
Potassium: 4.3 mmol/L (ref 3.5–5.3)
Sodium: 141 mmol/L (ref 135–146)
Total Bilirubin: 0.2 mg/dL (ref 0.2–1.2)
Total Protein: 7.2 g/dL (ref 6.1–8.1)

## 2022-08-06 LAB — HEMOGLOBIN A1C
Hgb A1c MFr Bld: 7.1 % of total Hgb — ABNORMAL HIGH (ref <5.7)
Mean Plasma Glucose: 157 mg/dL
eAG (mmol/L): 8.7 mmol/L

## 2022-08-08 ENCOUNTER — Other Ambulatory Visit: Payer: Self-pay | Admitting: Family

## 2022-08-08 MED ORDER — METFORMIN HCL ER 500 MG PO TB24: 500.0000 mg | ORAL_TABLET | Freq: Every day | ORAL | 0 refills | Status: DC

## 2022-08-09 ENCOUNTER — Other Ambulatory Visit: Payer: Self-pay | Admitting: Family

## 2022-08-25 ENCOUNTER — Ambulatory Visit: Admit: 2022-08-25 | Discharge status: Absent without leave | Payer: BC Managed Care – PPO

## 2022-08-25 ENCOUNTER — Ambulatory Visit
Admission: EM | Admit: 2022-08-25 | Discharge: 2022-08-25 | Disposition: A | Discharge status: Home / Self Care | Payer: BC Managed Care – PPO | Attending: Emergency Medicine | Admitting: Emergency Medicine

## 2022-08-25 DIAGNOSIS — K529 Noninfective gastroenteritis and colitis, unspecified: Secondary | ICD-10-CM | POA: Diagnosis not present

## 2022-08-25 LAB — POCT URINE PREGNANCY: Preg Test, Ur: NEGATIVE

## 2022-08-25 MED ORDER — ONDANSETRON 4 MG PO TBDP: 4.0000 mg | ORAL_TABLET | Freq: Four times a day (QID) | ORAL | 0 refills | Status: DC | PRN

## 2022-08-25 MED ORDER — ONDANSETRON 4 MG PO TBDP
4.0000 mg | ORAL_TABLET | Freq: Once | ORAL | Status: AC
  Administered 2022-08-25: 4 mg via ORAL

## 2022-08-25 NOTE — Discharge Instructions (Signed)
Zofran every 6 hours as needed to settle the stomach LOTS of fluids!! Stay hydrated! Bland diet as tolerated

## 2022-08-25 NOTE — ED Triage Notes (Signed)
Pt presents with c/o emesis and diarrhea that began yesterday.Pt endorses vomiting once today.
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
## 2022-09-04 IMAGING — DX DG CHEST 2V
2 series · 2 of 2 positions shown · non-contrast
Comparison: 08/28/2018

CLINICAL DATA: 24-year-old female with cough for 3 months

EXAM:
CHEST - 2 VIEW

[chest pa]
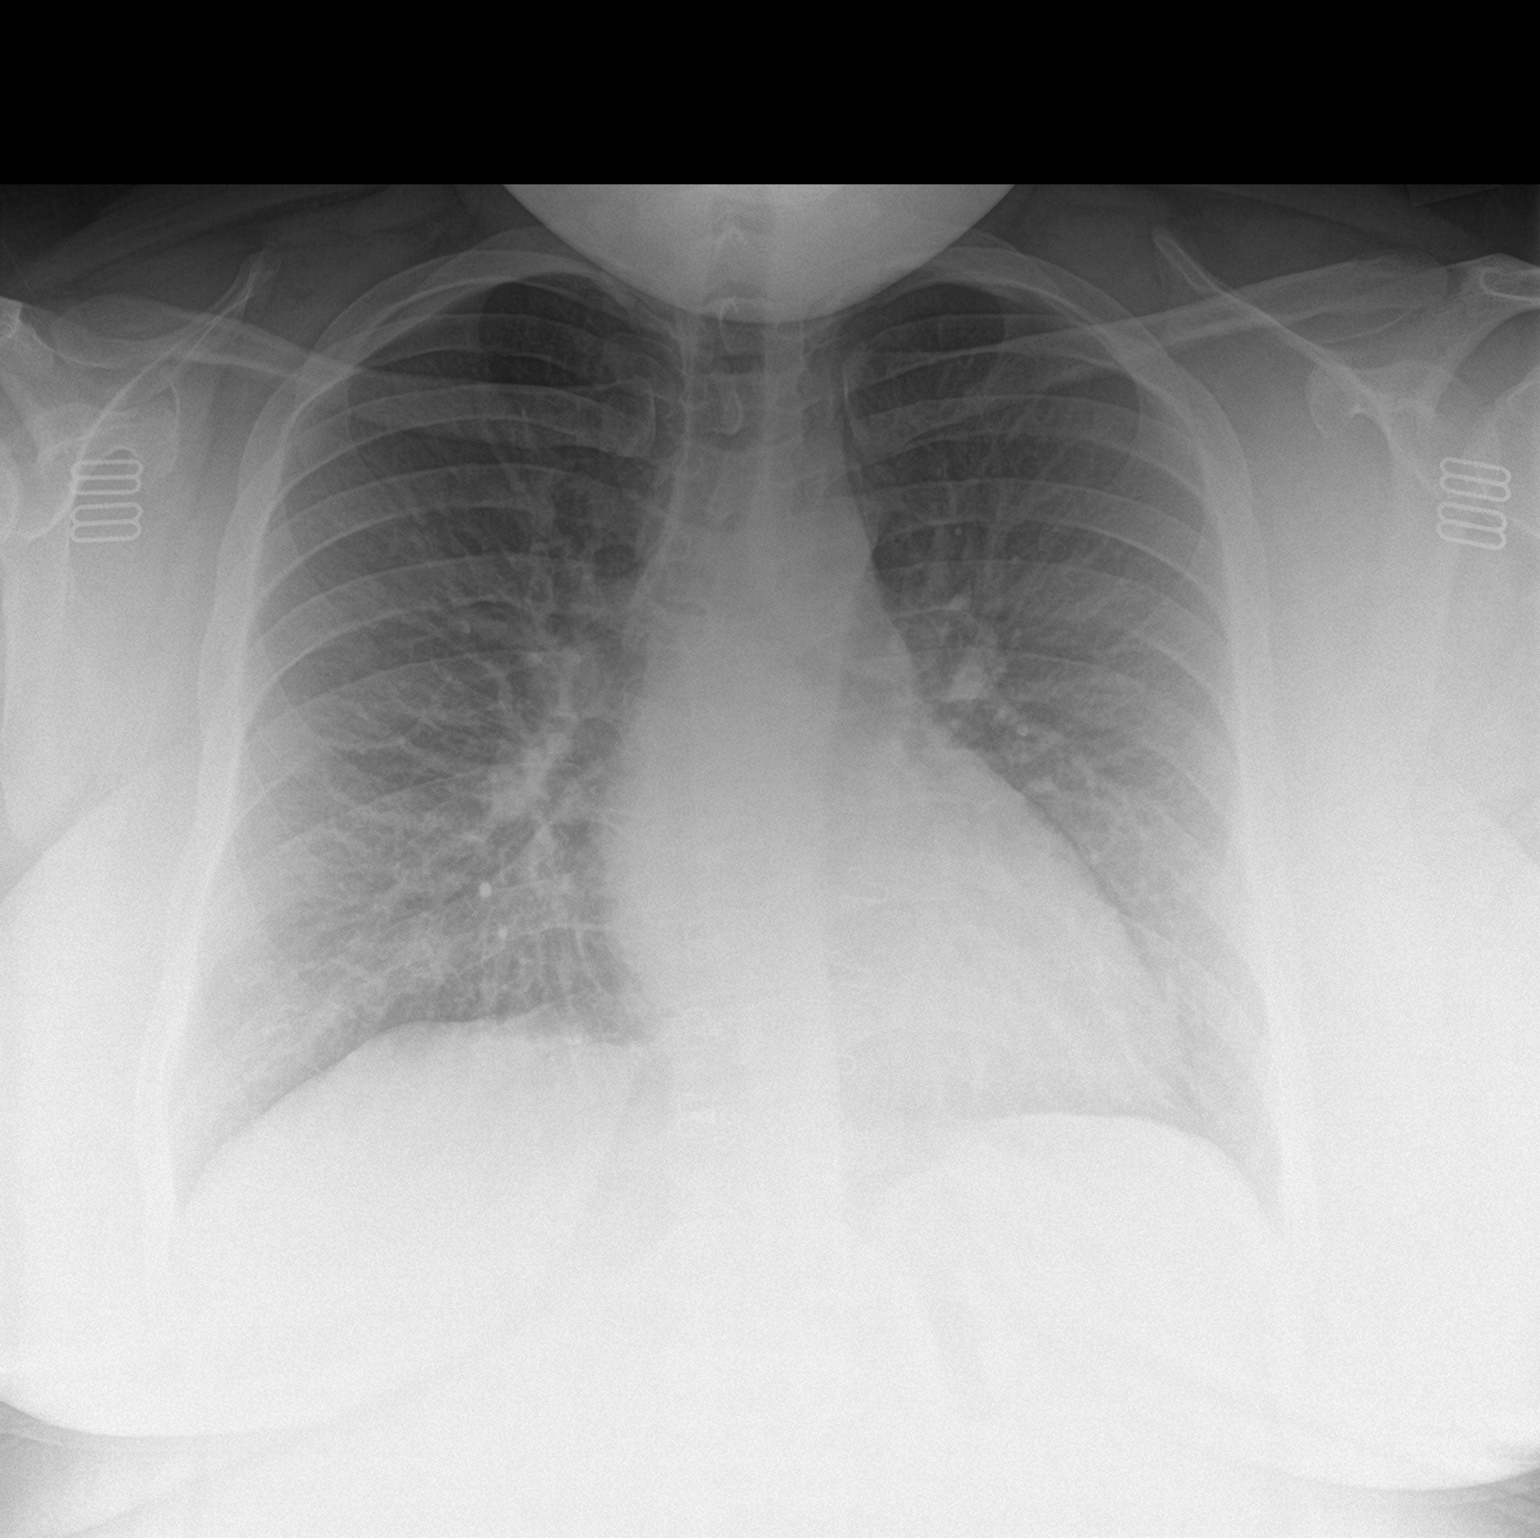

[chest lat]
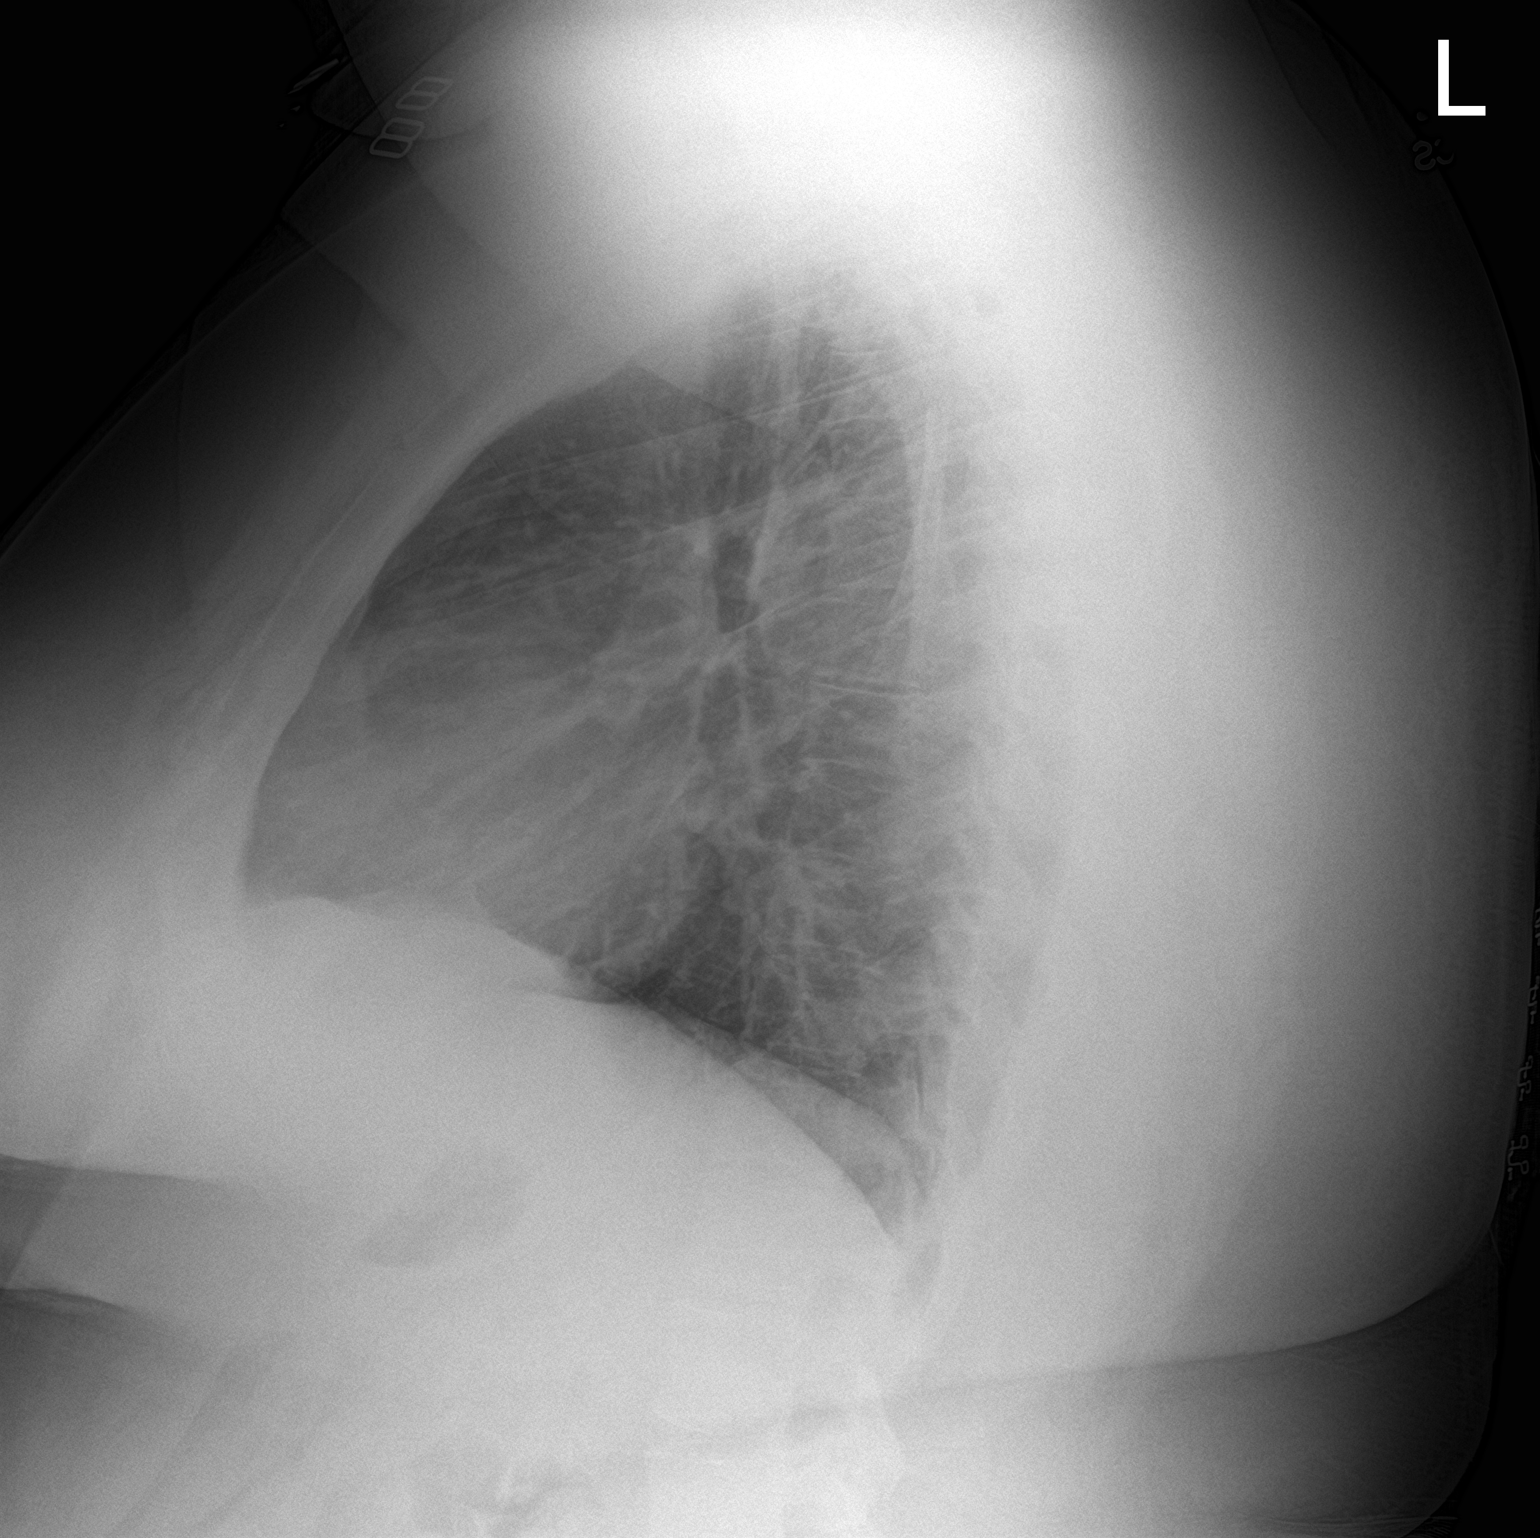

[2 of 2 positions shown; findings below may reference images not displayed]

FINDINGS: Cardiomediastinal silhouette unchanged in size and contour. No
interlobular septal thickening. No pneumothorax or pleural effusion.
No new confluent airspace disease.

Scoliotic curvature of the spine is again demonstrated. No displaced
fracture identified.
IMPRESSION: Negative for acute cardiopulmonary disease

## 2022-09-06 ENCOUNTER — Encounter: Payer: Self-pay | Admitting: Family

## 2022-09-06 ENCOUNTER — Ambulatory Visit: Payer: BC Managed Care – PPO | Admitting: Family

## 2022-09-06 VITALS — BP 138/86 | HR 103 | Resp 18 | Ht 67.0 in | Wt >= 6400 oz

## 2022-09-06 DIAGNOSIS — I10 Essential (primary) hypertension: Secondary | ICD-10-CM

## 2022-09-06 DIAGNOSIS — E119 Type 2 diabetes mellitus without complications: Secondary | ICD-10-CM

## 2022-09-06 MED ORDER — VALSARTAN-HYDROCHLOROTHIAZIDE 160-25 MG PO TABS: 1.0000 | ORAL_TABLET | Freq: Every day | ORAL | 1 refills | Status: DC

## 2022-09-06 MED ORDER — METFORMIN HCL ER 500 MG PO TB24: 500.0000 mg | ORAL_TABLET | Freq: Two times a day (BID) | ORAL | 0 refills | Status: DC
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
## 2022-11-02 ENCOUNTER — Other Ambulatory Visit: Payer: Self-pay | Admitting: Family

## 2022-11-09 ENCOUNTER — Other Ambulatory Visit: Payer: Self-pay | Admitting: Family

## 2022-11-10 MED ORDER — METFORMIN HCL ER 500 MG PO TB24: 500.0000 mg | ORAL_TABLET | Freq: Two times a day (BID) | ORAL | 1 refills | Status: DC

## 2022-11-17 ENCOUNTER — Other Ambulatory Visit: Payer: Self-pay | Admitting: Family

## 2023-01-10 ENCOUNTER — Ambulatory Visit (INDEPENDENT_AMBULATORY_CARE_PROVIDER_SITE_OTHER): Payer: Self-pay | Admitting: Family

## 2023-01-10 ENCOUNTER — Encounter: Payer: Self-pay | Admitting: Family

## 2023-01-10 VITALS — BP 144/92 | HR 105 | Ht 67.0 in | Wt >= 6400 oz

## 2023-01-10 DIAGNOSIS — I10 Essential (primary) hypertension: Secondary | ICD-10-CM

## 2023-01-10 DIAGNOSIS — L910 Hypertrophic scar: Secondary | ICD-10-CM

## 2023-01-10 DIAGNOSIS — R7309 Other abnormal glucose: Secondary | ICD-10-CM

## 2023-01-10 MED ORDER — VALSARTAN-HYDROCHLOROTHIAZIDE 320-25 MG PO TABS: 1.0000 | ORAL_TABLET | Freq: Every day | ORAL | 0 refills | Status: DC

## 2023-01-10 NOTE — Patient Instructions (Addendum)
  Please check to see if you qualify for Medicaid- this will help with your medication cost;   [email or web address]
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
## 2023-01-11 ENCOUNTER — Encounter: Payer: Self-pay | Admitting: Family

## 2023-01-11 LAB — CBC WITH DIFFERENTIAL/PLATELET
Basophils Absolute: 0.1 10*3/uL (ref 0.0–0.1)
Basophils Relative: 1 % (ref 0.0–3.0)
Eosinophils Absolute: 0.3 10*3/uL (ref 0.0–0.7)
Eosinophils Relative: 2.7 % (ref 0.0–5.0)
HCT: 36.6 % (ref 36.0–46.0)
Hemoglobin: 11.6 g/dL — ABNORMAL LOW (ref 12.0–15.0)
Lymphocytes Relative: 21.9 % (ref 12.0–46.0)
Lymphs Abs: 2.5 10*3/uL (ref 0.7–4.0)
MCHC: 31.6 g/dL (ref 30.0–36.0)
MCV: 78.8 fl (ref 78.0–100.0)
Monocytes Absolute: 0.7 10*3/uL (ref 0.1–1.0)
Monocytes Relative: 5.8 % (ref 3.0–12.0)
Neutro Abs: 7.8 10*3/uL — ABNORMAL HIGH (ref 1.4–7.7)
Neutrophils Relative %: 68.6 % (ref 43.0–77.0)
Platelets: 407 10*3/uL — ABNORMAL HIGH (ref 150.0–400.0)
RBC: 4.65 Mil/uL (ref 3.87–5.11)
RDW: 15.8 % — ABNORMAL HIGH (ref 11.5–15.5)
WBC: 11.3 10*3/uL — ABNORMAL HIGH (ref 4.0–10.5)

## 2023-01-11 LAB — COMPREHENSIVE METABOLIC PANEL
ALT: 16 U/L (ref 0–35)
AST: 13 U/L (ref 0–37)
Albumin: 3.8 g/dL (ref 3.5–5.2)
Alkaline Phosphatase: 69 U/L (ref 39–117)
BUN: 9 mg/dL (ref 6–23)
CO2: 29 mEq/L (ref 19–32)
Calcium: 8.9 mg/dL (ref 8.4–10.5)
Chloride: 103 mEq/L (ref 96–112)
Creatinine, Ser: 0.66 mg/dL (ref 0.40–1.20)
GFR: 122.46 mL/min (ref >60.00)
Glucose, Bld: 106 mg/dL — ABNORMAL HIGH (ref 70–99)
Potassium: 4 mEq/L (ref 3.5–5.1)
Sodium: 140 mEq/L (ref 135–145)
Total Bilirubin: 0.3 mg/dL (ref 0.2–1.2)
Total Protein: 7.1 g/dL (ref 6.0–8.3)

## 2023-01-11 LAB — HEMOGLOBIN A1C: Hgb A1c MFr Bld: 7.5 % — ABNORMAL HIGH (ref 4.6–6.5)

## 2023-02-09 ENCOUNTER — Institutional Professional Consult (permissible substitution): Payer: Self-pay | Admitting: Plastic Surgery

## 2023-02-15 ENCOUNTER — Ambulatory Visit (INDEPENDENT_AMBULATORY_CARE_PROVIDER_SITE_OTHER): Payer: Medicaid Other | Admitting: Plastic Surgery

## 2023-02-15 ENCOUNTER — Telehealth: Payer: Self-pay | Admitting: Family

## 2023-02-15 VITALS — BP 157/84 | HR 107

## 2023-02-15 DIAGNOSIS — L910 Hypertrophic scar: Secondary | ICD-10-CM | POA: Diagnosis not present

## 2023-02-15 NOTE — Progress Notes (Signed)
Referring Provider Olive Bass, FNP [address] [place],  [place] [address]   CC:  Chief Complaint  Patient presents with   Advice Only   Skin Problem      Kristen Ellison is an 27 y.o. female.  HPI: Ms. Studer is a 27 year old female who presents today with complaints of a small keloid on the posterior aspect of her left earlobe.  She states this developed following a ear piercing.  Has been getting slightly larger over the past few months.  She states that the keloid does cause pain and itching and she would like to have it treated  No Known Allergies  Outpatient Encounter Medications as of 02/15/2023  Medication Sig   metFORMIN (GLUCOPHAGE-XR) 500 MG 24 hr tablet Take 1 tablet (500 mg total) by mouth 2 (two) times daily with a meal.   valsartan-hydrochlorothiazide (DIOVAN-HCT) 320-25 MG tablet Take 1 tablet by mouth daily.   No facility-administered encounter medications on file as of 02/15/2023.     Past Medical History:  Diagnosis Date   Anxiety    Depression    Diabetes mellitus, type II (HCC)    Polycystic ovary syndrome     Past Surgical History:  Procedure Laterality Date   TONSILLECTOMY Bilateral 2010   TONSILLECTOMY      Family History  Problem Relation Age of Onset   Hypertension Other    Diabetes Other    Cancer Other    Asthma Mother    Diabetes Mother    Diabetes Father    Hyperlipidemia Father    Hypertension Father    Diabetes Maternal Grandfather    Heart disease Maternal Grandfather    Hypertension Maternal Grandfather    Hypertension Paternal Grandmother    Hyperlipidemia Paternal Grandmother    Diabetes Paternal Grandmother    Diabetes Paternal Grandfather    Hyperlipidemia Paternal Grandfather    Hypertension Paternal Grandfather    Kidney disease Paternal Grandfather     Social History   Social History Narrative   Not on file     Review of Systems General: Denies fevers, chills, weight  loss CV: Denies chest pain, shortness of breath, palpitations Skin: 1 cm keloid on the posterior aspect of the left earlobe  Physical Exam    02/15/2023    9:49 AM 01/10/2023    2:18 PM 01/10/2023    1:44 PM  Vitals with BMI  Height   5\' 7"   Weight   424 lbs 13 oz  BMI   66.52  Systolic 157 144 132  Diastolic 84 92 92  Pulse 107  440    General:  No acute distress,  Alert and oriented, Non-Toxic, Normal speech and affect Integument: As noted above patient has a 1 cm keloid on the posterior aspect of the left earlobe. Mammogram: Not applicable Assessment/Plan Keloid: Discussed keloids and their management with the patient.  Given how small this keloid is it is possible that it will resolve with steroid injections.  Regardless of whether she has complete resolution or not I believe that a steroid injection today would assist with the pain and itching that she is currently experiencing.  And I will also make excision of the keloid easier.  I did discuss the risks of hypopigmentation with the patient.  She would like for me to inject the keloid today.  Will also schedule her for follow-up in 4 to 6 weeks.  Will schedule this is a procedure appointment which can  be changed to a thyroid injection if necessary.  Procedure: Injection of keloid with a 50-50 mixture of Kenalog 40 and 1% plain lidocaine  After discussing the potential risks and confirming the procedure site the keloid was injected with 0.4 mL of the 50-50 mixture of Kenalog 40 and 1% plain lidocaine.  There were no complications.  The patient was given instructions on scar massage and a follow-up appointment for 4 to 6 weeks.  Santiago Glad 02/15/2023, 10:46 AM

## 2023-02-15 NOTE — Telephone Encounter (Signed)
Medicaid is now documented as insurance in her chart; we had discussed trial of Ozempic once this was done; is she okay with starting this medication?  If yes, she would do 0.25 mg weekly x 4 weeks and then increase to 0.5 mg weekly x 4 weeks; OV in 8 weeks.

## 2023-02-16 NOTE — Telephone Encounter (Signed)
Spoke with pt, pt states she would like to go ahead and start Ozempic and would like [facility] [place]. Address is [address] [place] [address]. Pt is also requesting to have her Metformin sent in as well.

## 2023-02-17 ENCOUNTER — Other Ambulatory Visit: Payer: Self-pay | Admitting: Family

## 2023-02-17 MED ORDER — OZEMPIC (0.25 OR 0.5 MG/DOSE) 2 MG/3ML ~~LOC~~ SOPN: PEN_INJECTOR | SUBCUTANEOUS | 1 refills | Status: DC

## 2023-02-17 MED ORDER — METFORMIN HCL ER 500 MG PO TB24: 500.0000 mg | ORAL_TABLET | Freq: Two times a day (BID) | ORAL | 1 refills | Status: DC

## 2023-02-23 ENCOUNTER — Telehealth: Payer: Self-pay

## 2023-02-23 NOTE — Telephone Encounter (Signed)
*  Primary  Pharmacy Patient Advocate Encounter   Received notification from CoverMyMeds that prior authorization for Ozempic (0.25 or 0.5 MG/DOSE) 2MG /3ML pen-injectors  is required/requested.   Insurance verification completed.   The patient is insured through [facility] [employer].   Per test claim:  not submitted due to no chart notes of patient meeting criteria.  *Does the patient have ONE of the following: (1) Patient has had a trial and failure or insufficient response to metformin containing products, (2) Patient has a contraindication or adverse event to metformin, (3) Patient has established ASCVD (atherosclerotic cardiovascular disease); or Chronic Kidney Disease, (4) Patient is considered high-risk for ASCVD as defined as 14 years of age or older with 2 or more additional risk factors (for example; smoking; obesity; hypertension; dyslipidemia; or albuminuria)?  Key: [id number]

## 2023-02-24 NOTE — Telephone Encounter (Signed)
PA submitted with additional information provided and is pending determination.

## 2023-03-03 ENCOUNTER — Encounter: Payer: Self-pay | Admitting: Family

## 2023-03-03 ENCOUNTER — Other Ambulatory Visit: Payer: Self-pay | Admitting: Medical Genetics

## 2023-03-03 DIAGNOSIS — Z006 Encounter for examination for normal comparison and control in clinical research program: Secondary | ICD-10-CM

## 2023-03-03 NOTE — Telephone Encounter (Signed)
Good afternoon, just wanted to check on the status of pts PA for Ozempic.

## 2023-03-07 ENCOUNTER — Other Ambulatory Visit (HOSPITAL_COMMUNITY): Payer: Self-pay

## 2023-03-07 NOTE — Telephone Encounter (Signed)
Pharmacy Patient Advocate Encounter  Received notification from [facility] that Prior Authorization for Ozempic (0.25 or 0.5 MG/DOSE) 2MG /3ML pen-injectors has been APPROVED from 02/24/2023 to 01/28/2024   PA #/Case ID/Reference #: [id number]

## 2023-03-08 NOTE — Telephone Encounter (Signed)
Spoke with pt, pt is aware and expressed understanding.  

## 2023-03-22 ENCOUNTER — Ambulatory Visit (INDEPENDENT_AMBULATORY_CARE_PROVIDER_SITE_OTHER): Payer: Medicaid Other | Admitting: Plastic Surgery

## 2023-03-22 VITALS — BP 149/82 | HR 92

## 2023-03-22 DIAGNOSIS — L910 Hypertrophic scar: Secondary | ICD-10-CM

## 2023-03-22 NOTE — Progress Notes (Signed)
Procedure Note  Preoperative Dx: Left posterior earlobe keloid  Postoperative Dx: Same  Procedure: Excision of keloid and injection of Kenalog 40  Anesthesia: Lidocaine 1% with 1:100,000 epinephrine and 0.25% Sensorcaine   Indication for Procedure: Removal for pathologic diagnosis  Description of Procedure: Risks and complications were explained to the patient including recurrence.  Consent was confirmed and the patient understands the risks and benefits.  The potential complications and alternatives were explained and the patient consents.  The patient expressed understanding the option of not having the procedure and the risks of a scar.  Time out was called and all information was confirmed to be correct.    The area was prepped and drapped.  Local anesthetic was injected in the subcutaneous tissues.  After waiting for the local to take affect the keloid was excised sharply.  After obtaining hemostasis, the surgical wound was closed with interrupted 5-0 Prolene sutures in the wound bed was injected with 0.2 mL of a 50-50 mixture of Kenalog 40 and 1% plain lidocaine.  The surgical wound measured 1 cm.  A dressing was applied.  The patient was given instructions on how to care for the area and a follow up appointment.  Kristen Ellison tolerated the procedure well and there were no complications. The specimen was sent to pathology.

## 2023-03-27 LAB — DERMATOLOGY PATHOLOGY

## 2023-03-29 ENCOUNTER — Ambulatory Visit (INDEPENDENT_AMBULATORY_CARE_PROVIDER_SITE_OTHER): Payer: Medicaid Other | Admitting: Surgical

## 2023-03-29 ENCOUNTER — Encounter: Payer: Self-pay | Admitting: Surgical

## 2023-03-29 VITALS — BP 163/78 | HR 89

## 2023-03-29 DIAGNOSIS — L910 Hypertrophic scar: Secondary | ICD-10-CM

## 2023-03-29 NOTE — Progress Notes (Signed)
Patient is a 27 year old female here for follow-up after excision of keloid of her left ear with Dr. Ladona Ridgel on 03/22/2023.  Patient doing well today.  She does not have any issues at this time.  Prolene sutures were removed.  Patient tolerated well.  She did have a small opening of the incision, recommend Vaseline to this area daily.  We discussed following up in 4 to 6 weeks for reevaluation and 1 additional steroid injection to the area.  There is no signs of infection or concern on exam.  All of her questions were answered to her content.

## 2023-04-19 ENCOUNTER — Ambulatory Visit: Payer: Medicaid Other | Admitting: Physician Assistant

## 2023-05-16 ENCOUNTER — Other Ambulatory Visit: Payer: Self-pay | Admitting: Family

## 2023-05-30 ENCOUNTER — Other Ambulatory Visit: Payer: Self-pay | Admitting: Family

## 2023-07-06 ENCOUNTER — Ambulatory Visit (HOSPITAL_COMMUNITY): Payer: Medicaid Other | Admitting: Licensed Clinical Social Worker

## 2023-07-06 DIAGNOSIS — F4310 Post-traumatic stress disorder, unspecified: Secondary | ICD-10-CM | POA: Insufficient documentation
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
## 2023-07-14 ENCOUNTER — Encounter: Payer: Self-pay | Admitting: Family

## 2023-07-19 ENCOUNTER — Ambulatory Visit (INDEPENDENT_AMBULATORY_CARE_PROVIDER_SITE_OTHER): Payer: Medicaid Other | Admitting: Licensed Clinical Social Worker

## 2023-07-19 DIAGNOSIS — F411 Generalized anxiety disorder: Secondary | ICD-10-CM

## 2023-07-19 DIAGNOSIS — F4310 Post-traumatic stress disorder, unspecified: Secondary | ICD-10-CM | POA: Diagnosis not present
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
## 2023-07-26 ENCOUNTER — Ambulatory Visit (INDEPENDENT_AMBULATORY_CARE_PROVIDER_SITE_OTHER): Payer: Medicaid Other | Admitting: Licensed Clinical Social Worker

## 2023-07-26 DIAGNOSIS — F4310 Post-traumatic stress disorder, unspecified: Secondary | ICD-10-CM | POA: Diagnosis not present
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
## 2023-07-27 ENCOUNTER — Other Ambulatory Visit: Payer: Self-pay | Admitting: Family

## 2023-08-07 ENCOUNTER — Ambulatory Visit (HOSPITAL_COMMUNITY): Payer: Medicaid Other | Admitting: Licensed Clinical Social Worker

## 2023-08-10 ENCOUNTER — Ambulatory Visit (HOSPITAL_COMMUNITY): Payer: Medicaid Other | Admitting: Licensed Clinical Social Worker

## 2023-08-10 ENCOUNTER — Encounter (HOSPITAL_COMMUNITY): Payer: Self-pay
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
## 2023-08-14 ENCOUNTER — Encounter (HOSPITAL_COMMUNITY): Payer: Self-pay | Admitting: Psychiatry

## 2023-08-14 ENCOUNTER — Ambulatory Visit (HOSPITAL_COMMUNITY): Payer: Medicaid Other | Admitting: Psychiatry

## 2023-08-14 DIAGNOSIS — F4310 Post-traumatic stress disorder, unspecified: Secondary | ICD-10-CM | POA: Diagnosis not present

## 2023-08-14 DIAGNOSIS — F3342 Major depressive disorder, recurrent, in full remission: Secondary | ICD-10-CM | POA: Insufficient documentation

## 2023-08-14 DIAGNOSIS — F339 Major depressive disorder, recurrent, unspecified: Secondary | ICD-10-CM | POA: Diagnosis not present

## 2023-08-14 DIAGNOSIS — F331 Major depressive disorder, recurrent, moderate: Secondary | ICD-10-CM

## 2023-08-14 MED ORDER — SERTRALINE HCL 25 MG PO TABS: ORAL_TABLET | ORAL | 1 refills | Status: DC

## 2023-08-14 MED ORDER — HYDROXYZINE HCL 25 MG PO TABS: 25.0000 mg | ORAL_TABLET | Freq: Two times a day (BID) | ORAL | 1 refills | Status: DC | PRN

## 2023-08-14 NOTE — Patient Instructions (Signed)
Thank you for attending your appointment today.  -- START Zoloft 12.5 mg daily for 1 week then INCREASE to 25 mg daily -- START hydroxyzine 25 mg up to twice daily as needed for anxiety/sleep -- Continue other medications as prescribed.  Please do not make any changes to medications without first discussing with your provider. If you are experiencing a psychiatric emergency, please call 911 or present to your nearest emergency department. Additional crisis, medication management, and therapy resources are included below.  [facility]  [address] [place], [place] [address] [phone] WALK-IN URGENT CARE 24/7 FOR ANYONE [address], [place], [place]  [phone] Fax: [phone] [email or web address] *Interpreters available *Accepts all insurance and uninsured for Urgent Care needs *Accepts Medicaid and uninsured for outpatient treatment (below)      ONLY FOR [facility]  Below:    Outpatient New Patient Assessment/Therapy Walk-ins:        Monday, Wednesday, and Thursday 8am until slots are full (first come, first served)                   New Patient Psychiatry/Medication Management        Monday-Friday 8am-11am (first come, first served)               For all walk-ins we ask that you arrive by 7:15am, because patients will be seen in the order of arrival.

## 2023-08-23 ENCOUNTER — Ambulatory Visit (HOSPITAL_COMMUNITY): Payer: Medicaid Other | Admitting: Licensed Clinical Social Worker

## 2023-08-23 DIAGNOSIS — F4310 Post-traumatic stress disorder, unspecified: Secondary | ICD-10-CM
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
## 2023-08-24 ENCOUNTER — Other Ambulatory Visit: Payer: Self-pay | Admitting: Family

## 2023-08-31 ENCOUNTER — Ambulatory Visit (HOSPITAL_COMMUNITY): Payer: Medicaid Other | Admitting: Licensed Clinical Social Worker

## 2023-08-31 DIAGNOSIS — F411 Generalized anxiety disorder: Secondary | ICD-10-CM

## 2023-08-31 DIAGNOSIS — F4310 Post-traumatic stress disorder, unspecified: Secondary | ICD-10-CM | POA: Diagnosis not present
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
## 2023-09-06 ENCOUNTER — Other Ambulatory Visit (HOSPITAL_COMMUNITY): Payer: Self-pay | Admitting: Psychiatry

## 2023-09-07 ENCOUNTER — Other Ambulatory Visit: Payer: Self-pay | Admitting: Family
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
## 2023-09-27 ENCOUNTER — Telehealth (INDEPENDENT_AMBULATORY_CARE_PROVIDER_SITE_OTHER): Payer: Medicaid Other | Admitting: Psychiatry

## 2023-09-27 ENCOUNTER — Encounter (HOSPITAL_COMMUNITY): Payer: Self-pay | Admitting: Psychiatry

## 2023-09-27 DIAGNOSIS — F331 Major depressive disorder, recurrent, moderate: Secondary | ICD-10-CM

## 2023-09-27 DIAGNOSIS — F4310 Post-traumatic stress disorder, unspecified: Secondary | ICD-10-CM

## 2023-09-27 MED ORDER — SERTRALINE HCL 25 MG PO TABS: 25.0000 mg | ORAL_TABLET | Freq: Every day | ORAL | 2 refills | Status: DC

## 2023-09-27 NOTE — Patient Instructions (Signed)

## 2023-09-28 ENCOUNTER — Ambulatory Visit (HOSPITAL_COMMUNITY): Payer: Medicaid Other | Admitting: Licensed Clinical Social Worker

## 2023-09-28 DIAGNOSIS — F4310 Post-traumatic stress disorder, unspecified: Secondary | ICD-10-CM | POA: Diagnosis not present

## 2023-09-28 DIAGNOSIS — F331 Major depressive disorder, recurrent, moderate: Secondary | ICD-10-CM

## 2023-09-28 DIAGNOSIS — F411 Generalized anxiety disorder: Secondary | ICD-10-CM

## 2023-09-28 NOTE — Progress Notes (Unsigned)
 THERAPIST PROGRESS NOTE  Virtual Visit via Video Note  I connected with Kristen Ellison on 09/28/23 at  4:00 PM EDT by a video enabled telemedicine application and verified that I am speaking with the correct person using two identifiers.  Location: Patient: [facility]  Provider: Providers Home    I discussed the limitations of evaluation and management by telemedicine and the availability of in person appointments. The patient expressed understanding and agreed to proceed.     I discussed the assessment and treatment plan with the patient. The patient was provided an opportunity to ask questions and all were answered. The patient agreed with the plan and demonstrated an understanding of the instructions.   The patient was advised to call back or seek an in-person evaluation if the symptoms worsen or if the condition fails to improve as anticipated.  I provided 45 minutes of non-face-to-face time during this encounter.   Kristen Cooks, LCSW   Participation Level: Active  Behavioral Response: CasualAlertAnxious and Depressed  Type of Therapy: Individual Therapy  Treatment Goals addressed:  Active     BH CCP Acute or Chronic Trauma Reaction     LTG: Elimination of maladaptive behaviors and thinking patterns which interfere with resolution of trauma as evidenced by identifying three cognitive distortions  (Progressing)     Start:  07/06/23    Expected End:  12/22/23         LTG: Develop and implement effective coping skills to carry out normal responsibilities and participate constructively in relationships as evidenced by attending 1 social and church event per week  (Progressing)     Start:  07/06/23    Expected End:  12/22/23         STG: Report a decrease in PTSD symptoms as evidenced by a 50% reduction in overall score on a clinician administered PTSD assessment screen/scale (Progressing)     Start:  07/06/23    Expected End:  12/22/23         STG:  Kristen Calico "Abran Duke" will experience a 50% reduction in exaggerated beliefs about self and others that interfere with trauma resolution as evidenced by self-report (Progressing)     Start:  07/06/23    Expected End:  12/22/23         STG: Increase participation in previously avoided activities such as spending time with friends  (Progressing)     Start:  07/06/23    Expected End:  12/22/23         STG: Kristen Face" will cooperate with and complete psychological testing to help evaluate trauma symptoms (Progressing)     Start:  07/06/23    Expected End:  12/22/23         STG: Kristen Face" will describe the signs and symptoms of PTSD that are experienced and how they interfere with daily living (Progressing)     Start:  07/06/23    Expected End:  12/22/23         STG: Kristen Face" will identify internal and external stimuli that trigger PTSD symptoms (Progressing)     Start:  07/06/23    Expected End:  12/22/23         STG: Kristen Face" will acknowledge that healing from PTSD is a gradual process (Progressing)     Start:  07/06/23    Expected End:  12/22/23         STG: Kristen Face" will identify negative coping strategies that have been used to cope with the  feelings associated with the trauma (Progressing)     Start:  07/06/23    Expected End:  12/22/23         STG: Kristen Calico "Wilds" will identify coping strategies to deal with trauma memories and the associated emotional reaction (Progressing)     Start:  07/06/23    Expected End:  12/22/23         Work with Kristen Face" to track symptoms, triggers, and/or skill use through a mood chart, diary card, or journal (Completed)     Start:  07/06/23    End:  07/19/23      Encourage Kristen Calico "Abran Duke" to participate in recovery peer support activities  (Completed)     Start:  07/06/23    End:  07/19/23      Instruct Kristen Calico "Abran Duke" to take psychotropic medication as prescribed  (Completed)     Start:  07/06/23    End:  07/19/23      Instruct Kristen Calico "Abran Duke" to communicate effects of prescribed medications (Completed)     Start:  07/06/23    End:  08/23/23      Provide Kristen Face" with education on trauma-oriented therapy (Completed)     Start:  07/06/23    End:  08/23/23      Provide and outline the treatment process to Kristen Face", explaining that it will include a gradual processing of the details and feelings associated with the trauma and developing new, more appropriate coping strategies (Completed)     Start:  07/06/23    End:  08/23/23      Cooperate with trauma-focused psychotherapy techniques to reduce emotional reaction to the traumatic event  (Completed)     Start:  07/06/23    End:  08/23/23      Refer Kristen Calico "Abran Duke" to a psychiatrist for a consultation regarding medication management of symptoms  (Completed)     Start:  07/06/23    End:  08/23/23         ProgressTowards Goals: Progressing  Interventions: CBT, Motivational Interviewing, and Supportive  Summary: Kristen Ellison is a 28 y.o. female who presents with   Suicidal/Homicidal: Nowithout intent/plan  Therapist Response:       Plan: Return again in 3 weeks.  Diagnosis: No diagnosis found.  Collaboration of Care: Other None today   Patient/Guardian was advised Release of Information must be obtained prior to any record release in order to collaborate their care with an outside provider. Patient/Guardian was advised if they have not already done so to contact the registration department to sign all necessary forms in order for [place] to release information regarding their care.   Consent: Patient/Guardian gives verbal consent for treatment and assignment of benefits for services provided during this visit. Patient/Guardian expressed understanding and agreed to proceed.   Kristen Cooks, LCSW 09/28/2023

## 2023-10-16 ENCOUNTER — Ambulatory Visit (INDEPENDENT_AMBULATORY_CARE_PROVIDER_SITE_OTHER): Admitting: Licensed Clinical Social Worker

## 2023-10-16 DIAGNOSIS — F4310 Post-traumatic stress disorder, unspecified: Secondary | ICD-10-CM

## 2023-10-16 DIAGNOSIS — F411 Generalized anxiety disorder: Secondary | ICD-10-CM

## 2023-10-16 DIAGNOSIS — F331 Major depressive disorder, recurrent, moderate: Secondary | ICD-10-CM | POA: Diagnosis not present
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
## 2023-10-17 ENCOUNTER — Other Ambulatory Visit: Payer: Self-pay | Admitting: Family

## 2023-10-22 ENCOUNTER — Other Ambulatory Visit (HOSPITAL_COMMUNITY): Payer: Self-pay | Admitting: Psychiatry

## 2023-11-03 ENCOUNTER — Encounter: Payer: Self-pay | Admitting: Family

## 2023-11-03 ENCOUNTER — Other Ambulatory Visit
Admission: RE | Admit: 2023-11-03 | Discharge: 2023-11-03 | Disposition: A | Discharge status: Home / Self Care | Payer: Self-pay | Source: Ambulatory Visit | Attending: Medical Genetics | Admitting: Medical Genetics

## 2023-11-03 ENCOUNTER — Ambulatory Visit (INDEPENDENT_AMBULATORY_CARE_PROVIDER_SITE_OTHER): Admitting: Family

## 2023-11-03 VITALS — BP 126/82 | HR 90 | Ht 67.0 in | Wt >= 6400 oz

## 2023-11-03 DIAGNOSIS — Z7984 Long term (current) use of oral hypoglycemic drugs: Secondary | ICD-10-CM | POA: Diagnosis not present

## 2023-11-03 DIAGNOSIS — N926 Irregular menstruation, unspecified: Secondary | ICD-10-CM | POA: Diagnosis not present

## 2023-11-03 DIAGNOSIS — E282 Polycystic ovarian syndrome: Secondary | ICD-10-CM

## 2023-11-03 DIAGNOSIS — Z6841 Body Mass Index (BMI) 40.0 and over, adult: Secondary | ICD-10-CM | POA: Diagnosis not present

## 2023-11-03 DIAGNOSIS — Z7985 Long-term (current) use of injectable non-insulin antidiabetic drugs: Secondary | ICD-10-CM | POA: Diagnosis not present

## 2023-11-03 DIAGNOSIS — E119 Type 2 diabetes mellitus without complications: Secondary | ICD-10-CM | POA: Diagnosis not present

## 2023-11-03 DIAGNOSIS — Z006 Encounter for examination for normal comparison and control in clinical research program: Secondary | ICD-10-CM | POA: Insufficient documentation

## 2023-11-03 LAB — CBC WITH DIFFERENTIAL/PLATELET
Basophils Absolute: 0.1 10*3/uL (ref 0.0–0.1)
Basophils Relative: 0.6 % (ref 0.0–3.0)
Eosinophils Absolute: 0.2 10*3/uL (ref 0.0–0.7)
Eosinophils Relative: 2.5 % (ref 0.0–5.0)
HCT: 37.5 % (ref 36.0–46.0)
Hemoglobin: 11.8 g/dL — ABNORMAL LOW (ref 12.0–15.0)
Lymphocytes Relative: 24.9 % (ref 12.0–46.0)
Lymphs Abs: 2.3 10*3/uL (ref 0.7–4.0)
MCHC: 31.5 g/dL (ref 30.0–36.0)
MCV: 79.6 fl (ref 78.0–100.0)
Monocytes Absolute: 0.5 10*3/uL (ref 0.1–1.0)
Monocytes Relative: 5.8 % (ref 3.0–12.0)
Neutro Abs: 6 10*3/uL (ref 1.4–7.7)
Neutrophils Relative %: 66.2 % (ref 43.0–77.0)
Platelets: 396 10*3/uL (ref 150.0–400.0)
RBC: 4.7 Mil/uL (ref 3.87–5.11)
RDW: 16.2 % — ABNORMAL HIGH (ref 11.5–15.5)
WBC: 9 10*3/uL (ref 4.0–10.5)

## 2023-11-03 LAB — IBC + FERRITIN
Ferritin: 44.9 ng/mL (ref 10.0–291.0)
Iron: 31 ug/dL — ABNORMAL LOW (ref 42–145)
Saturation Ratios: 8.8 % — ABNORMAL LOW (ref 20.0–50.0)
TIBC: 352.8 ug/dL (ref 250.0–450.0)
Transferrin: 252 mg/dL (ref 212.0–360.0)

## 2023-11-03 LAB — COMPREHENSIVE METABOLIC PANEL WITH GFR
ALT: 14 U/L (ref 0–35)
AST: 11 U/L (ref 0–37)
Albumin: 4 g/dL (ref 3.5–5.2)
Alkaline Phosphatase: 67 U/L (ref 39–117)
BUN: 11 mg/dL (ref 6–23)
CO2: 31 meq/L (ref 19–32)
Calcium: 9.1 mg/dL (ref 8.4–10.5)
Chloride: 101 meq/L (ref 96–112)
Creatinine, Ser: 0.71 mg/dL (ref 0.40–1.20)
GFR: 118.02 mL/min (ref >60.00)
Glucose, Bld: 115 mg/dL — ABNORMAL HIGH (ref 70–99)
Potassium: 4.3 meq/L (ref 3.5–5.1)
Sodium: 140 meq/L (ref 135–145)
Total Bilirubin: 0.3 mg/dL (ref 0.2–1.2)
Total Protein: 7.5 g/dL (ref 6.0–8.3)

## 2023-11-03 LAB — HEMOGLOBIN A1C: Hgb A1c MFr Bld: 7 % — ABNORMAL HIGH (ref 4.6–6.5)

## 2023-11-03 MED ORDER — VALSARTAN-HYDROCHLOROTHIAZIDE 320-25 MG PO TABS: 1.0000 | ORAL_TABLET | Freq: Every day | ORAL | 3 refills | Status: AC

## 2023-11-03 MED ORDER — OZEMPIC (0.25 OR 0.5 MG/DOSE) 2 MG/3ML ~~LOC~~ SOPN: 0.5000 mg | PEN_INJECTOR | SUBCUTANEOUS | 1 refills | Status: DC

## 2023-11-03 MED ORDER — METFORMIN HCL ER 500 MG PO TB24: 500.0000 mg | ORAL_TABLET | Freq: Two times a day (BID) | ORAL | 1 refills | Status: AC
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
## 2023-11-08 ENCOUNTER — Other Ambulatory Visit: Payer: Self-pay | Admitting: Family

## 2023-11-08 ENCOUNTER — Ambulatory Visit
Admission: RE | Admit: 2023-11-08 | Discharge: 2023-11-08 | Disposition: A | Discharge status: Home / Self Care | Source: Ambulatory Visit | Attending: Family | Admitting: Family

## 2023-11-08 DIAGNOSIS — Z6841 Body Mass Index (BMI) 40.0 and over, adult: Secondary | ICD-10-CM

## 2023-11-08 DIAGNOSIS — N926 Irregular menstruation, unspecified: Secondary | ICD-10-CM | POA: Diagnosis not present

## 2023-11-08 DIAGNOSIS — E282 Polycystic ovarian syndrome: Secondary | ICD-10-CM | POA: Insufficient documentation

## 2023-11-08 DIAGNOSIS — E119 Type 2 diabetes mellitus without complications: Secondary | ICD-10-CM

## 2023-11-09 ENCOUNTER — Ambulatory Visit (HOSPITAL_COMMUNITY): Admitting: Licensed Clinical Social Worker

## 2023-11-09 ENCOUNTER — Ambulatory Visit: Payer: Self-pay | Admitting: Family

## 2023-11-09 DIAGNOSIS — F411 Generalized anxiety disorder: Secondary | ICD-10-CM

## 2023-11-09 DIAGNOSIS — F331 Major depressive disorder, recurrent, moderate: Secondary | ICD-10-CM

## 2023-11-09 DIAGNOSIS — F4310 Post-traumatic stress disorder, unspecified: Secondary | ICD-10-CM
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
## 2023-11-12 ENCOUNTER — Other Ambulatory Visit (HOSPITAL_COMMUNITY): Payer: Self-pay | Admitting: Psychiatry

## 2023-11-14 LAB — GENECONNECT MOLECULAR SCREEN: Genetic Analysis Overall Interpretation: NEGATIVE

## 2023-11-24 ENCOUNTER — Encounter: Payer: Self-pay | Admitting: Family

## 2023-11-30 ENCOUNTER — Ambulatory Visit (HOSPITAL_COMMUNITY): Admitting: Licensed Clinical Social Worker

## 2023-11-30 DIAGNOSIS — F4310 Post-traumatic stress disorder, unspecified: Secondary | ICD-10-CM

## 2023-11-30 DIAGNOSIS — F330 Major depressive disorder, recurrent, mild: Secondary | ICD-10-CM

## 2023-11-30 DIAGNOSIS — F411 Generalized anxiety disorder: Secondary | ICD-10-CM

## 2023-11-30 NOTE — Progress Notes (Signed)
 THERAPIST PROGRESS NOTE  Virtual Visit via Video Note  I connected with Kristen Ellison on 11/30/23 at  2:00 PM EDT by a video enabled telemedicine application and verified that I am speaking with the correct person using two identifiers.  Location: Patient: Kristen Ellison  Provider: Providers Home    I discussed the limitations of evaluation and management by telemedicine and the availability of in person appointments. The patient expressed understanding and agreed to proceed.    I discussed the assessment and treatment plan with the patient. The patient was provided an opportunity to ask questions and all were answered. The patient agreed with the plan and demonstrated an understanding of the instructions.   The patient was advised to call back or seek an in-person evaluation if the symptoms worsen or if the condition fails to improve as anticipated.  I provided 45 minutes of non-face-to-face time during this encounter.   [name], LCSW   Participation Level: Active  Behavioral Response: CasualAlertAnxious and Depressed  Type of Therapy: Individual Therapy  Treatment Goals addressed:  Active     BH CCP Acute or Chronic Trauma Reaction     LTG: Elimination of maladaptive behaviors and thinking patterns which interfere with resolution of trauma as evidenced by identifying three cognitive distortions  (Progressing)     Start:  07/06/23    Expected End:  12/22/23         LTG: Develop and implement effective coping skills to carry out normal responsibilities and participate constructively in relationships as evidenced by attending 1 social and church event per week  (Completed/Met)     Start:  07/06/23    Expected End:  12/22/23    Resolved:  11/09/23      STG: Report a decrease in PTSD symptoms as evidenced by a 50% reduction in overall score on a clinician administered PTSD assessment screen/scale (Completed/Met)     Start:  07/06/23    Expected End:   12/22/23    Resolved:  11/09/23    Goal Note     Pt reports no longer avoiding driving due to trauma          STG: Kristen "Ellison" will experience a 50% reduction in exaggerated beliefs about self and others that interfere with trauma resolution as evidenced by self-report (Progressing)     Start:  07/06/23    Expected End:  12/22/23         STG: Increase participation in previously avoided activities such as spending time with friends  (Completed/Met)     Start:  07/06/23    Expected End:  12/22/23    Resolved:  11/09/23    Goal Note     Engaging in church going on a women's retreat, Pt also hanging with friends and family weekly          STG: Kristen "Ellison" will cooperate with and complete psychological testing to help evaluate trauma symptoms (Completed/Met)     Start:  07/06/23    Expected End:  12/22/23    Resolved:  11/09/23    Goal Note     Pt has been compliant    with therapy and medication management for 4 month          STG: Kristen Ellison" will describe the signs and symptoms of PTSD that are experienced and how they interfere with daily living (Progressing)     Start:  07/06/23    Expected End:  12/22/23  STG: Kristen Ellison" will identify internal and external stimuli that trigger PTSD symptoms (Progressing)     Start:  07/06/23    Expected End:  12/22/23         STG: Kristen Ellison" will acknowledge that healing from PTSD is a gradual process (Progressing)     Start:  07/06/23    Expected End:  12/22/23         STG: Kristen Ellison" will identify negative coping strategies that have been used to cope with the feelings associated with the trauma (Progressing)     Start:  07/06/23    Expected End:  12/22/23         STG: Kristen Ellison" will identify coping strategies to deal with trauma memories and the associated emotional reaction (Progressing)     Start:  07/06/23    Expected End:  12/22/23         Work  with Kristen Ellison" to track symptoms, triggers, and/or skill use through a mood chart, diary card, or journal (Completed)     Start:  07/06/23    End:  07/19/23      Encourage Kristen Ellison "Kristen Ellison" to participate in recovery peer support activities  (Completed)     Start:  07/06/23    End:  07/19/23      Instruct Kristen Ellison "Kristen Ellison" to take psychotropic medication as prescribed (Completed)     Start:  07/06/23    End:  07/19/23      Instruct Kristen Ellison "Kristen Ellison" to communicate effects of prescribed medications (Completed)     Start:  07/06/23    End:  08/23/23      Provide Kristen Ellison" with education on trauma-oriented therapy (Completed)     Start:  07/06/23    End:  08/23/23      Provide and outline the treatment process to Kristen Ellison", explaining that it will include a gradual processing of the details and feelings associated with the trauma and developing new, more appropriate coping strategies (Completed)     Start:  07/06/23    End:  08/23/23      Cooperate with trauma-focused psychotherapy techniques to reduce emotional reaction to the traumatic event  (Completed)     Start:  07/06/23    End:  08/23/23      Refer Kristen Ellison "Kristen Ellison" to a psychiatrist for a consultation regarding medication management of symptoms  (Completed)     Start:  07/06/23    End:  08/23/23         ProgressTowards Goals: Progressing  Interventions: CBT, Motivational Interviewing, and Supportive    Suicidal/Homicidal: Nowithout intent/plan  Therapist Response:   Pt was alert and oriented x 5. She was dressed casually and engaged well in therapy session. She presented with euthymic mood/affect. Graw was pleasant, cooperative and maintained good eye contact.  [name] has been doing "well". She endorses an overall decrease in PTSD symptoms. NO problems reports with medications. 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 This  will affect her ability to do full time classes this fall. Pt states this may cause some frustration with her mother as her mom would like to see her back in school furthering her career opportunities. LCSW and pt spoke today about going to school part time while working full time.  Pt agree that working and school in some form is going to be her best plan.   Intervention/Plan: LCSW used supportive therapy for praise and encouragement. LCSW used  psychoanalytic therapy for pt to express thoughts, feeling, and emotions. LCSW used person center therapy for empowerment. Pt reports a decrease in avoidance behaviors such as driving and socialization. She reports utilization of support systems and family    Plan: Return again in 4 weeks.  Diagnosis: PTSD (post-traumatic stress disorder)  Generalized anxiety disorder  Mild episode of recurrent major depressive disorder (HCC)  Collaboration of Care: Other None today   Patient/Guardian was advised Release of Information must be obtained prior to any record release in order to collaborate their care with an outside provider. Patient/Guardian was advised if they have not already done so to contact the registration department to sign all necessary forms in order for us  to release information regarding their care.   Consent: Patient/Guardian gives verbal consent for treatment and assignment of benefits for services provided during this visit. Patient/Guardian expressed understanding and agreed to proceed.   [name], LCSW 11/30/2023
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
## 2023-12-01 ENCOUNTER — Telehealth (HOSPITAL_COMMUNITY): Admitting: Psychiatry

## 2023-12-01 ENCOUNTER — Encounter (HOSPITAL_COMMUNITY): Payer: Self-pay | Admitting: Psychiatry

## 2023-12-01 DIAGNOSIS — F330 Major depressive disorder, recurrent, mild: Secondary | ICD-10-CM | POA: Diagnosis not present

## 2023-12-01 DIAGNOSIS — F4310 Post-traumatic stress disorder, unspecified: Secondary | ICD-10-CM | POA: Diagnosis not present

## 2023-12-01 MED ORDER — SERTRALINE HCL 25 MG PO TABS: 25.0000 mg | ORAL_TABLET | Freq: Every day | ORAL | 1 refills | Status: DC

## 2023-12-01 NOTE — Patient Instructions (Signed)

## 2023-12-05 ENCOUNTER — Encounter: Payer: Self-pay | Admitting: Certified Nurse Midwife

## 2023-12-05 ENCOUNTER — Ambulatory Visit (INDEPENDENT_AMBULATORY_CARE_PROVIDER_SITE_OTHER): Admitting: Certified Nurse Midwife

## 2023-12-05 VITALS — BP 87/62 | HR 86 | Ht 67.0 in | Wt >= 6400 oz

## 2023-12-05 DIAGNOSIS — E282 Polycystic ovarian syndrome: Secondary | ICD-10-CM | POA: Diagnosis not present

## 2023-12-05 DIAGNOSIS — N946 Dysmenorrhea, unspecified: Secondary | ICD-10-CM

## 2023-12-05 DIAGNOSIS — N926 Irregular menstruation, unspecified: Secondary | ICD-10-CM

## 2023-12-05 MED ORDER — NORETHINDRONE ACETATE 5 MG PO TABS: 5.0000 mg | ORAL_TABLET | Freq: Every day | ORAL | 0 refills | Status: DC
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
## 2023-12-18 ENCOUNTER — Ambulatory Visit (HOSPITAL_COMMUNITY): Admitting: Licensed Clinical Social Worker

## 2023-12-18 DIAGNOSIS — F330 Major depressive disorder, recurrent, mild: Secondary | ICD-10-CM

## 2023-12-18 DIAGNOSIS — F4310 Post-traumatic stress disorder, unspecified: Secondary | ICD-10-CM

## 2023-12-18 DIAGNOSIS — F411 Generalized anxiety disorder: Secondary | ICD-10-CM
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
## 2023-12-26 ENCOUNTER — Other Ambulatory Visit: Payer: Self-pay | Admitting: Family

## 2023-12-27 NOTE — Progress Notes (Unsigned)
  Start: *** end: *** Patient is here today *** Patient would like to learn *** Patient lives with ***.  *** shopping and cooking.  History includes:  *** Medications include:  *** Labs noted:  ***   7%, ozempic , met, BMI 60

## 2024-01-01 ENCOUNTER — Encounter: Payer: Self-pay | Admitting: Certified Nurse Midwife

## 2024-01-03 ENCOUNTER — Encounter: Attending: Family | Admitting: Dietician

## 2024-01-03 DIAGNOSIS — E119 Type 2 diabetes mellitus without complications: Secondary | ICD-10-CM | POA: Diagnosis present

## 2024-01-03 DIAGNOSIS — Z6841 Body Mass Index (BMI) 40.0 and over, adult: Secondary | ICD-10-CM | POA: Insufficient documentation

## 2024-01-03 NOTE — Patient Instructions (Signed)
 1-Avoid skipping breakfast- Aim for a snack in replace of a skipped meal

## 2024-01-10 ENCOUNTER — Other Ambulatory Visit: Payer: Self-pay

## 2024-01-10 ENCOUNTER — Emergency Department
Admission: EM | Admit: 2024-01-10 | Discharge: 2024-01-11 | Disposition: A | Discharge status: Home / Self Care | Attending: Emergency Medicine | Admitting: Emergency Medicine

## 2024-01-10 ENCOUNTER — Emergency Department

## 2024-01-10 DIAGNOSIS — E119 Type 2 diabetes mellitus without complications: Secondary | ICD-10-CM | POA: Insufficient documentation

## 2024-01-10 DIAGNOSIS — N939 Abnormal uterine and vaginal bleeding, unspecified: Secondary | ICD-10-CM | POA: Insufficient documentation

## 2024-01-10 DIAGNOSIS — Z7984 Long term (current) use of oral hypoglycemic drugs: Secondary | ICD-10-CM | POA: Insufficient documentation

## 2024-01-10 DIAGNOSIS — R9431 Abnormal electrocardiogram [ECG] [EKG]: Secondary | ICD-10-CM | POA: Diagnosis not present

## 2024-01-10 DIAGNOSIS — R42 Dizziness and giddiness: Secondary | ICD-10-CM | POA: Diagnosis not present

## 2024-01-10 LAB — COMPREHENSIVE METABOLIC PANEL WITH GFR
ALT: 19 U/L (ref 0–44)
AST: 14 U/L — ABNORMAL LOW (ref 15–41)
Albumin: 3.6 g/dL (ref 3.5–5.0)
Alkaline Phosphatase: 53 U/L (ref 38–126)
Anion gap: 9 (ref 5–15)
BUN: 11 mg/dL (ref 6–20)
CO2: 25 mmol/L (ref 22–32)
Calcium: 8.8 mg/dL — ABNORMAL LOW (ref 8.9–10.3)
Chloride: 104 mmol/L (ref 98–111)
Creatinine, Ser: 1.06 mg/dL — ABNORMAL HIGH (ref 0.44–1.00)
GFR, Estimated: >60 mL/min (ref >60)
Glucose, Bld: 135 mg/dL — ABNORMAL HIGH (ref 70–99)
Potassium: 3.5 mmol/L (ref 3.5–5.1)
Sodium: 138 mmol/L (ref 135–145)
Total Bilirubin: 0.5 mg/dL (ref 0.0–1.2)
Total Protein: 8.2 g/dL — ABNORMAL HIGH (ref 6.5–8.1)

## 2024-01-10 LAB — CBC WITH DIFFERENTIAL/PLATELET
Abs Immature Granulocytes: 0.09 K/uL — ABNORMAL HIGH (ref 0.00–0.07)
Basophils Absolute: 0 K/uL (ref 0.0–0.1)
Basophils Relative: 0 %
Eosinophils Absolute: 0.3 K/uL (ref 0.0–0.5)
Eosinophils Relative: 2 %
HCT: 36 % (ref 36.0–46.0)
Hemoglobin: 11.3 g/dL — ABNORMAL LOW (ref 12.0–15.0)
Immature Granulocytes: 1 %
Lymphocytes Relative: 26 %
Lymphs Abs: 3.4 K/uL (ref 0.7–4.0)
MCH: 25.3 pg — ABNORMAL LOW (ref 26.0–34.0)
MCHC: 31.4 g/dL (ref 30.0–36.0)
MCV: 80.5 fL (ref 80.0–100.0)
Monocytes Absolute: 0.6 K/uL (ref 0.1–1.0)
Monocytes Relative: 5 %
Neutro Abs: 8.8 K/uL — ABNORMAL HIGH (ref 1.7–7.7)
Neutrophils Relative %: 66 %
Platelets: 454 K/uL — ABNORMAL HIGH (ref 150–400)
RBC: 4.47 MIL/uL (ref 3.87–5.11)
RDW: 15.8 % — ABNORMAL HIGH (ref 11.5–15.5)
WBC: 13.2 K/uL — ABNORMAL HIGH (ref 4.0–10.5)
nRBC: 0 % (ref 0.0–0.2)

## 2024-01-10 LAB — POC URINE PREG, ED: Preg Test, Ur: NEGATIVE

## 2024-01-10 MED ORDER — SODIUM CHLORIDE 0.9 % IV BOLUS
1000.0000 mL | Freq: Once | INTRAVENOUS | Status: AC
  Administered 2024-01-11: 1000 mL via INTRAVENOUS

## 2024-01-10 NOTE — ED Provider Notes (Signed)
 [facility] Provider Note    Event Date/Time   First MD Initiated Contact with Patient 01/10/24 2343     (approximate)   History   Vaginal Bleeding   HPI  Kristen Ellison is a 28 y.o. female   Past medical history of diabetic on Ozempic , depression and anxiety, PCOS, presents to the Emergency Department with ongoing pelvic cramping and vaginal bleeding spotting over several months.  This is unchanged despite starting OCP recently.  She has upper abdominal cramping and nausea as well over the last several weeks, and has just recently increased her Ozempic  dosing.  She denies GI bleeding.  No urinary symptoms.  No fevers no chills.  She is not sexually active, has no vaginal discharge.  She had a pelvic ultrasound in May that showed no gross abnormalities but once and repeat for reassurance today  Independent Historian contributed to assessment above: Mother is here at bedside to corroborate information past medical history above  External Medical Documents Reviewed: Pelvic ultrasound from May that showed no acute abnormalities      Physical Exam   Triage Vital Signs: ED Triage Vitals  Encounter Vitals Group     BP 01/10/24 1940 136/82     Girls Systolic BP Percentile --      Girls Diastolic BP Percentile --      Boys Systolic BP Percentile --      Boys Diastolic BP Percentile --      Pulse Rate 01/10/24 1940 (!) 102     Resp 01/10/24 1940 17     Temp 01/10/24 1935 98.6 F (37 C)     Temp src --      SpO2 01/10/24 1940 100 %     Weight 01/10/24 1936 (!) 413 lb (187.3 kg)     Height 01/10/24 1936 5' 7 (1.702 m)     Head Circumference --      Peak Flow --      Pain Score 01/10/24 1936 7     Pain Loc --      Pain Education --      Exclude from Growth Chart --     Most recent vital signs: Vitals:   01/11/24 0100 01/11/24 0230  BP: (!) 111/58 105/83  Pulse: 96 96  Resp: 19 (!) 22  Temp:    SpO2: 100% 100%     General: Awake, no distress.  CV:  Good peripheral perfusion.  Resp:  Normal effort.  Abd:  No distention. Other:  Scant blood dried on her pad with no active hemorrhage.  Soft benign abdominal exam, nonfocal no rigidity or guarding.  Hemodynamics appropriate reassuring   ED Results / Procedures / Treatments   Labs (all labs ordered are listed, but only abnormal results are displayed) Labs Reviewed  CBC WITH DIFFERENTIAL/PLATELET - Abnormal; Notable for the following components:      Result Value   WBC 13.2 (*)    Hemoglobin 11.3 (*)    MCH 25.3 (*)    RDW 15.8 (*)    Platelets 454 (*)    Neutro Abs 8.8 (*)    Abs Immature Granulocytes 0.09 (*)    All other components within normal limits  COMPREHENSIVE METABOLIC PANEL WITH GFR - Abnormal; Notable for the following components:   Glucose, Bld 135 (*)    Creatinine, Ser 1.06 (*)    Calcium 8.8 (*)    Total Protein 8.2 (*)    AST 14 (*)    All  other components within normal limits  POC URINE PREG, ED     I ordered and reviewed the above labs they are notable for H&H stable compared to prior, white blood cell count mildly elevated.  Not pregnant.  EKG  ED ECG REPORT I, Ginnie Shams, the attending physician, personally viewed and interpreted this ECG.   Date: 01/10/2024  EKG Time: 2354  Rate: 87  Rhythm: sinus  Axis: nl  Intervals:nl  ST&T Change: no stemi  RADIOLOGY I independently reviewed and interpreted pelvic ultrasound see no obvious masses I also reviewed radiologist's formal read.   PROCEDURES:  Critical Care performed: No  Procedures   MEDICATIONS ORDERED IN ED: Medications  sodium chloride  0.9 % bolus 1,000 mL (0 mLs Intravenous Stopped 01/11/24 0232)    IMPRESSION / MDM / ASSESSMENT AND PLAN / ED COURSE  I reviewed the triage vital signs and the nursing notes.                                Patient's presentation is most consistent with acute presentation with potential threat to life or  bodily function.  Differential diagnosis includes, but is not limited to, acute blood loss anemia, fibroid, pregnancy related like ectopic pregnancy, intra-abdominal infection, obstruction   The patient is on the cardiac monitor to evaluate for evidence of arrhythmia and/or significant heart rate changes.  MDM:    In terms of her ongoing vaginal bleeding and intermittent cramping pain, I think it is best that she follow-up with her gynecology as today we fortunately did not show any structural abnormalities on her pelvic ultrasound, there is no evidence of acute blood loss anemia and she is hemodynamically stable with slow oozing blood and no active hemorrhaging.  She is not pregnant.  In terms of her upper abdominal discomfort there is a very benign abdominal exam so I doubt surgical abdominal pathologies at this time and may be related to her increased dosing of Ozempic  and so she will follow-up with her doctor regarding medication management.  I considered hospitalization for admission or observation however given her young healthy status and benign overall exam and evaluation as above I think she can follow-up with her gynecologist and primary doctor        FINAL CLINICAL IMPRESSION(S) / ED DIAGNOSES   Final diagnoses:  Abnormal vaginal bleeding     Rx / DC Orders   ED Discharge Orders          Ordered    ondansetron  (ZOFRAN ) 4 MG tablet  Every 8 hours PRN        01/11/24 0225             Note:  This document was prepared using Dragon voice recognition software and may include unintentional dictation errors.    Shams Ginnie, MD 01/11/24 [phone]

## 2024-01-10 NOTE — ED Provider Notes (Incomplete)
 [facility] Provider Note    Event Date/Time   First MD Initiated Contact with Patient 01/10/24 2343     (approximate)   History   Vaginal Bleeding   HPI  Kristen Ellison is a 28 y.o. female   Past medical history of ***    Independent Historian contributed to assessment above: ***  External Medical Documents Reviewed: ***      Physical Exam   Triage Vital Signs: ED Triage Vitals  Encounter Vitals Group     BP 01/10/24 1940 136/82     Girls Systolic BP Percentile --      Girls Diastolic BP Percentile --      Boys Systolic BP Percentile --      Boys Diastolic BP Percentile --      Pulse Rate 01/10/24 1940 (!) 102     Resp 01/10/24 1940 17     Temp 01/10/24 1935 98.6 F (37 C)     Temp src --      SpO2 01/10/24 1940 100 %     Weight 01/10/24 1936 (!) 413 lb (187.3 kg)     Height 01/10/24 1936 5' 7 (1.702 m)     Head Circumference --      Peak Flow --      Pain Score 01/10/24 1936 7     Pain Loc --      Pain Education --      Exclude from Growth Chart --     Most recent vital signs: Vitals:   01/10/24 1935 01/10/24 1940  BP:  136/82  Pulse:  (!) 102  Resp:  17  Temp: 98.6 F (37 C)   SpO2:  100%    General: Awake, no distress. *** CV:  Good peripheral perfusion. *** Resp:  Normal effort. *** Abd:  No distention. *** Other:  ***   ED Results / Procedures / Treatments   Labs (all labs ordered are listed, but only abnormal results are displayed) Labs Reviewed  CBC WITH DIFFERENTIAL/PLATELET - Abnormal; Notable for the following components:      Result Value   WBC 13.2 (*)    Hemoglobin 11.3 (*)    MCH 25.3 (*)    RDW 15.8 (*)    Platelets 454 (*)    Neutro Abs 8.8 (*)    Abs Immature Granulocytes 0.09 (*)    All other components within normal limits  COMPREHENSIVE METABOLIC PANEL WITH GFR - Abnormal; Notable for the following components:   Glucose, Bld 135 (*)    Creatinine, Ser 1.06 (*)    Calcium 8.8  (*)    Total Protein 8.2 (*)    AST 14 (*)    All other components within normal limits  POC URINE PREG, ED     I ordered and reviewed the above labs they are notable for ***  EKG  ED ECG REPORT I, Ginnie Shams, the attending physician, personally viewed and interpreted this ECG.   Date: 01/10/2024  EKG Time: ***  Rate: ***  Rhythm: {ekg findings:315101}  Axis: ***  Intervals:{conduction defects:17367}  ST&T Change: ***    RADIOLOGY I independently reviewed and interpreted *** I also reviewed radiologist's formal read.   PROCEDURES:  Critical Care performed: {CriticalCareYesNo:19197::Yes, see critical care procedure note(s),No}  Procedures   MEDICATIONS ORDERED IN ED: Medications - No data to display  External physician / consultants:  I spoke with *** regarding care plan for this patient.   IMPRESSION / MDM /  ASSESSMENT AND PLAN / ED COURSE  I reviewed the triage vital signs and the nursing notes.                                Patient's presentation is most consistent with {EM COPA:27473}  Differential diagnosis includes, but is not limited to, ***   ***The patient is on the cardiac monitor to evaluate for evidence of arrhythmia and/or significant heart rate changes.  MDM:  ***  I considered hospitalization for admission or observation ***        FINAL CLINICAL IMPRESSION(S) / ED DIAGNOSES   Final diagnoses:  None     Rx / DC Orders   ED Discharge Orders     None        Note:  This document was prepared using Dragon voice recognition software and may include unintentional dictation errors.

## 2024-01-10 NOTE — ED Notes (Addendum)
 Pt presented to ED with c/o increased vaginal bleeding with clots starting 7/3 and lower abd pain/ vaginal pain. Pt states was recently started on new medication, norethindrone , last month for period pain which was supposed to stop her menstrual cycle. States she attempted to follow up with GYN and was told to come to ED due to vaginal bleeding/ pain. Pt also endorses lightheadedness/ dizziness.

## 2024-01-10 NOTE — ED Triage Notes (Signed)
 POV with CC of irregular period with associated lightheadedness and dizziness that has been ongoing since 12/28/23. Started norethindrone  on 12/11/23. Ambulatory with steady gait.

## 2024-01-11 ENCOUNTER — Emergency Department

## 2024-01-11 DIAGNOSIS — R939 Diagnostic imaging inconclusive due to excess body fat of patient: Secondary | ICD-10-CM | POA: Diagnosis not present

## 2024-01-11 MED ORDER — ONDANSETRON HCL 4 MG PO TABS: 4.0000 mg | ORAL_TABLET | Freq: Three times a day (TID) | ORAL | 0 refills | Status: DC | PRN

## 2024-01-11 NOTE — ED Notes (Signed)
 Modesto Charon MD at bedside

## 2024-01-11 NOTE — Discharge Instructions (Addendum)
 Fortunately your evaluation in the ED did not show any emergency conditions that accounted for your vaginal bleeding.  It is very important that you follow-up with a gynecologist for further treatment options.  Take Zofran  as needed for nausea.  Drink plenty of fluids to stay well-hydrated.  Find Pedialyte or similar electrolyte rehydration formulas at your local pharmacy.  Talk to your doctor about your Ozempic  dosing as this may be affecting your symptoms as well.   Thank you for choosing us  for your health care today!  Please see your primary doctor this week for a follow up appointment.   If you have any new, worsening, or unexpected symptoms call your doctor right away or come back to the emergency department for reevaluation.  It was my pleasure to care for you today.   Ginnie EDISON Cyrena, MD

## 2024-01-15 ENCOUNTER — Encounter (HOSPITAL_COMMUNITY): Payer: Self-pay

## 2024-01-15 ENCOUNTER — Ambulatory Visit (HOSPITAL_COMMUNITY): Admitting: Licensed Clinical Social Worker

## 2024-01-26 ENCOUNTER — Telehealth: Payer: Self-pay

## 2024-01-26 ENCOUNTER — Other Ambulatory Visit (HOSPITAL_COMMUNITY): Payer: Self-pay

## 2024-01-26 NOTE — Telephone Encounter (Signed)
 Pharmacy Patient Advocate Encounter  Received notification from [employer] that Prior Authorization for Ozempic  (0.25 or 0.5 MG/DOSE) 2MG /3ML pen-injectors  has been APPROVED from 01/26/2024 to 01/25/2025    [id number]. OZEMPIC  INJ 2MG /3ML is approved

## 2024-01-26 NOTE — Telephone Encounter (Signed)
 Pharmacy Patient Advocate Encounter   Received notification from CoverMyMeds that prior authorization for Ozempic  (0.25 or 0.5 MG/DOSE) 2MG /3ML pen-injectors  is due for renewal.   Insurance verification completed.   The patient is insured through [facility] MEDICAID.  Action: PA required; PA started via CoverMyMeds. KEY [id number] . Waiting for clinical questions to populate.

## 2024-01-26 NOTE — Telephone Encounter (Signed)
 Clinical questions have been answered and PA submitted. PA currently Pending.

## 2024-02-06 ENCOUNTER — Ambulatory Visit: Admitting: Family

## 2024-02-12 ENCOUNTER — Ambulatory Visit (HOSPITAL_COMMUNITY): Admitting: Licensed Clinical Social Worker

## 2024-02-23 ENCOUNTER — Ambulatory Visit (HOSPITAL_COMMUNITY): Admitting: Licensed Clinical Social Worker

## 2024-02-23 DIAGNOSIS — F4310 Post-traumatic stress disorder, unspecified: Secondary | ICD-10-CM

## 2024-02-23 DIAGNOSIS — F411 Generalized anxiety disorder: Secondary | ICD-10-CM

## 2024-02-23 NOTE — Progress Notes (Signed)
 THERAPIST PROGRESS NOTE  Virtual Visit via Video Note  I connected with Kristen Ellison on 02/23/24 at 10:00 AM EDT by a video enabled telemedicine application and verified that I am speaking with the correct person using two identifiers.  Location: Patient: [name]  Provider: Providers Home    I discussed the limitations of evaluation and management by telemedicine and the availability of in person appointments. The patient expressed understanding and agreed to proceed.  I discussed the assessment and treatment plan with the patient. The patient was provided an opportunity to ask questions and all were answered. The patient agreed with the plan and demonstrated an understanding of the instructions.   The patient was advised to call back or seek an in-person evaluation if the symptoms worsen or if the condition fails to improve as anticipated.  I provided 45 minutes of non-face-to-face time during this encounter.   Kristen GORMAN Patee, LCSW   Participation Level: Active  Behavioral Response: CasualAlertAnxious  Type of Therapy: Individual Therapy  Treatment Goals addressed:  Active     BH CCP Acute or Chronic Trauma Reaction     LTG: Elimination of maladaptive behaviors and thinking patterns which interfere with resolution of trauma as evidenced by identifying three cognitive distortions  (Completed/Met)     Start:  07/06/23    Expected End:  07/26/24    Resolved:  02/23/24    Goal Note     Self reported for decreased isolation and being able to drive without fear.          LTG: Develop and implement effective coping skills to carry out normal responsibilities and participate constructively in relationships as evidenced by attending 1 social and church event per week  (Completed/Met)     Start:  07/06/23    Expected End:  12/22/23    Resolved:  11/09/23      STG: Report a decrease in PTSD symptoms as evidenced by a 50% reduction in overall score on a  clinician administered PTSD assessment screen/scale (Completed/Met)     Start:  07/06/23    Expected End:  12/22/23    Resolved:  11/09/23    Goal Note     Pt reports no longer avoiding driving due to trauma          STG: Kristen Ellison will experience a 50% reduction in exaggerated beliefs about self and others that interfere with trauma resolution as evidenced by self-report (Progressing)     Start:  07/06/23    Expected End:  07/26/24         STG: Increase participation in previously avoided activities such as spending time with friends  (Completed/Met)     Start:  07/06/23    Expected End:  12/22/23    Resolved:  11/09/23    Goal Note     Engaging in church going on a women's retreat, Pt also hanging with friends and family weekly          STG: Kristen Ellison will cooperate with and complete psychological testing to help evaluate trauma symptoms (Completed/Met)     Start:  07/06/23    Expected End:  12/22/23    Resolved:  11/09/23    Goal Note     Pt has been compliant    with therapy and medication management for 4 month          STG: Kristen Ellison will describe the signs and symptoms of PTSD that are experienced and how they interfere with  daily living (Progressing)     Start:  07/06/23    Expected End:  07/26/24         STG: Kristen Ellison will identify internal and external stimuli that trigger PTSD symptoms (Progressing)     Start:  07/06/23    Expected End:  07/26/24         STG: Kristen Ellison will acknowledge that healing from PTSD is a gradual process (Progressing)     Start:  07/06/23    Expected End:  07/26/24         STG: Kristen Ellison will identify negative coping strategies that have been used to cope with the feelings associated with the trauma (Progressing)     Start:  07/06/23    Expected End:  07/26/24         STG: Kristen Ellison will identify coping strategies to deal with trauma memories and the  associated emotional reaction (Progressing)     Start:  07/06/23    Expected End:  07/26/24         Work with Kristen Ellison to track symptoms, triggers, and/or skill use through a mood chart, diary card, or journal (Completed)     Start:  07/06/23    End:  07/19/23      Encourage Kristen Ellison to participate in recovery peer support activities  (Completed)     Start:  07/06/23    End:  07/19/23      Instruct Kristen Ellison to take psychotropic medication as prescribed (Completed)     Start:  07/06/23    End:  07/19/23      Instruct Kristen Ellison to communicate effects of prescribed medications (Completed)     Start:  07/06/23    End:  08/23/23      Provide Kristen Ellison with education on trauma-oriented therapy (Completed)     Start:  07/06/23    End:  08/23/23      Provide and outline the treatment process to Kristen Ellison, explaining that it will include a gradual processing of the details and feelings associated with the trauma and developing new, more appropriate coping strategies (Completed)     Start:  07/06/23    End:  08/23/23      Cooperate with trauma-focused psychotherapy techniques to reduce emotional reaction to the traumatic event  (Completed)     Start:  07/06/23    End:  08/23/23      Refer Kristen Ellison to a psychiatrist for a consultation regarding medication management of symptoms  (Completed)     Start:  07/06/23    End:  08/23/23         ProgressTowards Goals: Progressing  Interventions: CBT, Motivational Interviewing, and Supportive    Suicidal/Homicidal: Nowithout intent/plan  Therapist Response:    Kristen Ellison was alert and oriented x 5. She was dressed causally and engaged well in therapy session. She presented with depressed and anxious mood/affect. She was pleasant, cooperative, and maintained good eye contact.    Pt comes in today with stressor for work. Kristen Ellison endorses a decrease in  symptoms for PTSD as reexperiencing trauma event and avoiding reminder. AEB increase in driving and decrease in flash backs. She does reports stressors for work. Pt has a new boss who does not communicate well. Pt reports he can be disrespectful at times which pt has resentment towards and makes her not want to listen to him. She is attempting to give him "grace" due to him being new to the church. Pt  reports even his preaching can come off as arrogant.   Interventions/Plan: LCSW educated patient on Manufacturing systems engineer. LCSW provided patient worksheets on assertive communication and active listening. LCSW reviewed those worksheets with patient in session and provided them via e-mail to patient's listed e-mail in epic. LCSW validated feelings in session. LCSW utilized open-ended questions. LCSW utilized psychoanalytic therapy for patient to express thoughts, feelings, and emotions and nonjudgmental environment. Plan for patient is to review worksheets and present in next session how she has implemented them into her day-to-day life.    Plan: Return again in 6 weeks.  Diagnosis: Generalized anxiety disorder  PTSD (post-traumatic stress disorder)  Collaboration of Care: Other Continued individual therapy   Patient/Guardian was advised Release of Information must be obtained prior to any record release in order to collaborate their care with an outside provider. Patient/Guardian was advised if they have not already done so to contact the registration department to sign all necessary forms in order for us  to release information regarding their care.   Consent: Patient/Guardian gives verbal consent for treatment and assignment of benefits for services provided during this visit. Patient/Guardian expressed understanding and agreed to proceed.   Kristen GORMAN Patee, LCSW 02/23/2024
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
## 2024-02-29 ENCOUNTER — Other Ambulatory Visit: Payer: Self-pay | Admitting: Family

## 2024-02-29 ENCOUNTER — Encounter (HOSPITAL_COMMUNITY): Payer: Self-pay | Admitting: Psychiatry

## 2024-02-29 ENCOUNTER — Telehealth (HOSPITAL_COMMUNITY): Admitting: Psychiatry

## 2024-02-29 DIAGNOSIS — F4310 Post-traumatic stress disorder, unspecified: Secondary | ICD-10-CM

## 2024-02-29 DIAGNOSIS — F3342 Major depressive disorder, recurrent, in full remission: Secondary | ICD-10-CM

## 2024-02-29 MED ORDER — HYDROXYZINE HCL 25 MG PO TABS: ORAL_TABLET | ORAL | 2 refills | Status: DC

## 2024-02-29 MED ORDER — SERTRALINE HCL 25 MG PO TABS: 25.0000 mg | ORAL_TABLET | Freq: Every day | ORAL | 1 refills | Status: DC

## 2024-02-29 NOTE — Patient Instructions (Signed)
 Thank you for attending your appointment today.  -- INCREASE Atarax  to 50 mg nightly as needed for sleep -- Continue other medications as prescribed.  Please do not make any changes to medications without first discussing with your provider. If you are experiencing a psychiatric emergency, please call 911 or present to your nearest emergency department. Additional crisis, medication management, and therapy resources are included below.  [facility]  [address] [place], [place] [address] [phone] WALK-IN URGENT CARE 24/7 FOR ANYONE [address], [place], [place]  [phone] Fax: [phone] [email or web address] *Interpreters available *Accepts all insurance and uninsured for Urgent Care needs *Accepts Medicaid and uninsured for outpatient treatment (below)      ONLY FOR [facility]  Below:    Outpatient New Patient Assessment/Therapy Walk-ins:        Monday, Wednesday, and Thursday 8am until slots are full (first come, first served)                   New Patient Psychiatry/Medication Management        Monday-Friday 8am-11am (first come, first served)               For all walk-ins we ask that you arrive by 7:15am, because patients will be seen in the order of arrival.

## 2024-03-01 ENCOUNTER — Telehealth (HOSPITAL_COMMUNITY): Admitting: Psychiatry

## 2024-03-06 ENCOUNTER — Encounter (HOSPITAL_COMMUNITY): Payer: Self-pay | Admitting: Emergency Medicine

## 2024-03-06 ENCOUNTER — Emergency Department (HOSPITAL_COMMUNITY): Admission: EM | Admit: 2024-03-06 | Discharge: 2024-03-07 | Discharge status: Against Medical Advice

## 2024-03-06 ENCOUNTER — Emergency Department (HOSPITAL_COMMUNITY)

## 2024-03-06 DIAGNOSIS — Z5321 Procedure and treatment not carried out due to patient leaving prior to being seen by health care provider: Secondary | ICD-10-CM | POA: Insufficient documentation

## 2024-03-06 DIAGNOSIS — R0789 Other chest pain: Secondary | ICD-10-CM | POA: Diagnosis not present

## 2024-03-06 DIAGNOSIS — R079 Chest pain, unspecified: Secondary | ICD-10-CM | POA: Diagnosis present

## 2024-03-06 HISTORY — DX: Essential (primary) hypertension: I10

## 2024-03-06 LAB — BASIC METABOLIC PANEL WITH GFR
Anion gap: 10 (ref 5–15)
BUN: 8 mg/dL (ref 6–20)
CO2: 25 mmol/L (ref 22–32)
Calcium: 9 mg/dL (ref 8.9–10.3)
Chloride: 102 mmol/L (ref 98–111)
Creatinine, Ser: 0.9 mg/dL (ref 0.44–1.00)
GFR, Estimated: >60 mL/min (ref >60)
Glucose, Bld: 112 mg/dL — ABNORMAL HIGH (ref 70–99)
Potassium: 3.7 mmol/L (ref 3.5–5.1)
Sodium: 137 mmol/L (ref 135–145)

## 2024-03-06 LAB — CBC
HCT: 37.5 % (ref 36.0–46.0)
Hemoglobin: 11.2 g/dL — ABNORMAL LOW (ref 12.0–15.0)
MCH: 23.8 pg — ABNORMAL LOW (ref 26.0–34.0)
MCHC: 29.9 g/dL — ABNORMAL LOW (ref 30.0–36.0)
MCV: 79.6 fL — ABNORMAL LOW (ref 80.0–100.0)
Platelets: 480 K/uL — ABNORMAL HIGH (ref 150–400)
RBC: 4.71 MIL/uL (ref 3.87–5.11)
RDW: 15.6 % — ABNORMAL HIGH (ref 11.5–15.5)
WBC: 14.8 K/uL — ABNORMAL HIGH (ref 4.0–10.5)
nRBC: 0 % (ref 0.0–0.2)

## 2024-03-06 LAB — TROPONIN I (HIGH SENSITIVITY): Troponin I (High Sensitivity): 5 ng/L (ref <18)

## 2024-03-06 LAB — HCG, SERUM, QUALITATIVE: Preg, Serum: NEGATIVE

## 2024-03-06 LAB — D-DIMER, QUANTITATIVE: D-Dimer, Quant: 0.3 ug{FEU}/mL (ref 0.00–0.50)

## 2024-03-06 NOTE — ED Triage Notes (Signed)
 Pt reports she began having central CP last night that shoots through to her back. Denies SOB, n/v. Pt recently had 7 hr drive.

## 2024-03-06 NOTE — ED Notes (Signed)
 Called pt 3x for troponin in triage. NO answer.

## 2024-03-06 NOTE — ED Provider Triage Note (Signed)
 Emergency Medicine Provider Triage Evaluation Note  Yula Crotwell , a 28 y.o. female  was evaluated in triage.  Pt complains of sharp chest pain, worse with deep breaths.  Symptoms began yesterday and have worsened today, she did note that she recently had a 7+ hour car ride over the weekend.  No history of prior DVT/PE.  Review of Systems  Positive: As above Negative: As above  Physical Exam  BP (!) 144/97 (BP Location: Right Wrist)   Pulse 92   Temp 98.5 F (36.9 C) (Oral)   Resp 17   SpO2 100%  Gen:   Awake, no distress   Resp:  Normal effort  MSK:   Moves extremities without difficulty  Other:    Medical Decision Making  Medically screening exam initiated at 3:54 PM.  Appropriate orders placed.  Yury Chigozie Mccauslin was informed that the remainder of the evaluation will be completed by another provider, this initial triage assessment does not replace that evaluation, and the importance of remaining in the ED until their evaluation is complete.     Glendia Rocky SAILOR, [place] 03/06/24 1557

## 2024-03-06 NOTE — ED Triage Notes (Signed)
 PT arrives via POV. Pt reports chest pain since yesterday. Denies associated symptoms. Worsens with movement.

## 2024-03-06 NOTE — ED Notes (Signed)
 Pt called for vitals in lobby with no response.

## 2024-03-07 ENCOUNTER — Other Ambulatory Visit: Payer: Self-pay | Admitting: Certified Nurse Midwife

## 2024-03-07 DIAGNOSIS — N926 Irregular menstruation, unspecified: Secondary | ICD-10-CM

## 2024-03-07 DIAGNOSIS — E282 Polycystic ovarian syndrome: Secondary | ICD-10-CM

## 2024-03-07 DIAGNOSIS — N946 Dysmenorrhea, unspecified: Secondary | ICD-10-CM

## 2024-03-07 NOTE — Telephone Encounter (Signed)
 We received a refill request for patient's norethindrone .  You prescribed it in June #90 with no refills. Is it okay to refill or does she need to follow-up first?

## 2024-03-12 ENCOUNTER — Ambulatory Visit (INDEPENDENT_AMBULATORY_CARE_PROVIDER_SITE_OTHER): Admitting: Family

## 2024-03-12 ENCOUNTER — Encounter: Payer: Self-pay | Admitting: Family

## 2024-03-12 VITALS — BP 136/88 | HR 94 | Ht 67.0 in | Wt >= 6400 oz

## 2024-03-12 DIAGNOSIS — E119 Type 2 diabetes mellitus without complications: Secondary | ICD-10-CM

## 2024-03-12 DIAGNOSIS — R0789 Other chest pain: Secondary | ICD-10-CM

## 2024-03-12 LAB — CBC WITH DIFFERENTIAL/PLATELET
Basophils Absolute: 0 K/uL (ref 0.0–0.1)
Basophils Relative: 0.3 % (ref 0.0–3.0)
Eosinophils Absolute: 0.3 K/uL (ref 0.0–0.7)
Eosinophils Relative: 2.2 % (ref 0.0–5.0)
HCT: 36.8 % (ref 36.0–46.0)
Hemoglobin: 11.5 g/dL — ABNORMAL LOW (ref 12.0–15.0)
Lymphocytes Relative: 24.5 % (ref 12.0–46.0)
Lymphs Abs: 2.8 K/uL (ref 0.7–4.0)
MCHC: 31.2 g/dL (ref 30.0–36.0)
MCV: 75.3 fl — ABNORMAL LOW (ref 78.0–100.0)
Monocytes Absolute: 0.6 K/uL (ref 0.1–1.0)
Monocytes Relative: 5.3 % (ref 3.0–12.0)
Neutro Abs: 7.7 K/uL (ref 1.4–7.7)
Neutrophils Relative %: 67.7 % (ref 43.0–77.0)
Platelets: 438 K/uL — ABNORMAL HIGH (ref 150.0–400.0)
RBC: 4.89 Mil/uL (ref 3.87–5.11)
RDW: 16.6 % — ABNORMAL HIGH (ref 11.5–15.5)
WBC: 11.3 K/uL — ABNORMAL HIGH (ref 4.0–10.5)

## 2024-03-12 LAB — HEMOGLOBIN A1C: Hgb A1c MFr Bld: 7.3 % — ABNORMAL HIGH (ref 4.6–6.5)

## 2024-03-12 NOTE — Patient Instructions (Signed)
 Please go ahead and schedule a follow up/ TOC with a provider in [place] in about 1 month;   [facility]- [address] [place], [place] [address] Tel: [phone]
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
## 2024-03-13 ENCOUNTER — Ambulatory Visit: Payer: Self-pay | Admitting: Family

## 2024-03-13 ENCOUNTER — Other Ambulatory Visit: Payer: Self-pay | Admitting: Family

## 2024-03-13 MED ORDER — SEMAGLUTIDE (1 MG/DOSE) 4 MG/3ML ~~LOC~~ SOPN: 1.0000 mg | PEN_INJECTOR | SUBCUTANEOUS | 0 refills | Status: DC

## 2024-03-13 NOTE — Telephone Encounter (Signed)
 Patient called about test results. Told patient the results.

## 2024-03-15 ENCOUNTER — Ambulatory Visit
Admission: RE | Admit: 2024-03-15 | Discharge: 2024-03-15 | Disposition: A | Discharge status: Home / Self Care | Source: Ambulatory Visit | Attending: Family | Admitting: Family

## 2024-03-15 DIAGNOSIS — K8020 Calculus of gallbladder without cholecystitis without obstruction: Secondary | ICD-10-CM | POA: Diagnosis not present

## 2024-03-15 DIAGNOSIS — R0789 Other chest pain: Secondary | ICD-10-CM | POA: Insufficient documentation

## 2024-03-16 LAB — COMPREHENSIVE METABOLIC PANEL WITH GFR
ALT: 16 U/L (ref 0–35)
AST: 13 U/L (ref 0–37)
Albumin: 4 g/dL (ref 3.5–5.2)
Alkaline Phosphatase: 61 U/L (ref 39–117)
BUN: 9 mg/dL (ref 6–23)
CO2: 26 meq/L (ref 19–32)
Calcium: 9.1 mg/dL (ref 8.4–10.5)
Chloride: 107 meq/L (ref 96–112)
Creatinine, Ser: 0.66 mg/dL (ref 0.40–1.20)
GFR: 121.46 mL/min (ref >60.00)
Glucose, Bld: 105 mg/dL — ABNORMAL HIGH (ref 70–99)
Potassium: 4.4 meq/L (ref 3.5–5.1)
Sodium: 139 meq/L (ref 135–145)
Total Bilirubin: 0.3 mg/dL (ref 0.2–1.2)
Total Protein: 7.5 g/dL (ref 6.0–8.3)

## 2024-04-26 ENCOUNTER — Encounter (HOSPITAL_COMMUNITY): Payer: Self-pay

## 2024-04-26 ENCOUNTER — Ambulatory Visit (HOSPITAL_COMMUNITY): Admitting: Licensed Clinical Social Worker
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
## 2024-05-03 ENCOUNTER — Telehealth (INDEPENDENT_AMBULATORY_CARE_PROVIDER_SITE_OTHER): Admitting: Psychiatry

## 2024-05-03 ENCOUNTER — Ambulatory Visit (INDEPENDENT_AMBULATORY_CARE_PROVIDER_SITE_OTHER): Admitting: Nurse Practitioner

## 2024-05-03 ENCOUNTER — Encounter (HOSPITAL_COMMUNITY): Payer: Self-pay | Admitting: Psychiatry

## 2024-05-03 ENCOUNTER — Encounter: Payer: Self-pay | Admitting: Nurse Practitioner

## 2024-05-03 VITALS — BP 120/68 | HR 89 | Temp 98.4°F | Ht 67.0 in | Wt >= 6400 oz

## 2024-05-03 DIAGNOSIS — Z7985 Long-term (current) use of injectable non-insulin antidiabetic drugs: Secondary | ICD-10-CM | POA: Diagnosis not present

## 2024-05-03 DIAGNOSIS — E1165 Type 2 diabetes mellitus with hyperglycemia: Secondary | ICD-10-CM | POA: Diagnosis not present

## 2024-05-03 DIAGNOSIS — F3342 Major depressive disorder, recurrent, in full remission: Secondary | ICD-10-CM

## 2024-05-03 DIAGNOSIS — I10 Essential (primary) hypertension: Secondary | ICD-10-CM

## 2024-05-03 DIAGNOSIS — F4310 Post-traumatic stress disorder, unspecified: Secondary | ICD-10-CM

## 2024-05-03 DIAGNOSIS — Z7984 Long term (current) use of oral hypoglycemic drugs: Secondary | ICD-10-CM

## 2024-05-03 DIAGNOSIS — F411 Generalized anxiety disorder: Secondary | ICD-10-CM

## 2024-05-03 DIAGNOSIS — F5101 Primary insomnia: Secondary | ICD-10-CM

## 2024-05-03 DIAGNOSIS — E282 Polycystic ovarian syndrome: Secondary | ICD-10-CM

## 2024-05-03 MED ORDER — SERTRALINE HCL 25 MG PO TABS: 25.0000 mg | ORAL_TABLET | Freq: Every day | ORAL | 1 refills | Status: AC

## 2024-05-03 MED ORDER — ONDANSETRON 4 MG PO TBDP: 4.0000 mg | ORAL_TABLET | Freq: Three times a day (TID) | ORAL | 0 refills | Status: AC | PRN

## 2024-05-03 MED ORDER — HYDROXYZINE HCL 25 MG PO TABS: 25.0000 mg | ORAL_TABLET | Freq: Every evening | ORAL | 2 refills | Status: AC | PRN

## 2024-05-03 NOTE — Patient Instructions (Signed)

## 2024-05-03 NOTE — Progress Notes (Signed)
 BP 120/68   Pulse 89   Temp 98.4 F (36.9 C)   Ht 5' 7 (1.702 m)   Wt (!) 410 lb (186 kg)   LMP 05/03/2024   SpO2 97%   BMI 64.22 kg/m    Subjective:    Patient ID: Kristen Ellison, female    DOB: [birth year], 28 y.o.   MRN: [id number]  HPI: Kristen Ellison is a 28 y.o. female  Chief Complaint  Patient presents with   Establish Care   Discussed the use of AI scribe software for clinical note transcription with the patient, who gave verbal consent to proceed.  History of Present Illness Kristen Ellison is a 28 year old female with hypertension, type 2 diabetes, obesity, PCOS, anxiety, insomnia, depression, and PTSD who presents to establish care.  Glycemic control and diabetes management - Type 2 diabetes managed with metformin  500 mg twice daily and Ozempic  1 mg weekly, started one month ago - Nausea and vomiting as side effects of Ozempic , with one episode of vomiting - Last hemoglobin A1c was 7.3% in September 2025 - Recently acquired a blood glucose monitor - No history of insulin therapy - No diabetic eye exam or foot exam performed  Obesity and weight trajectory - Current weight is 410 pounds with BMI of 64.2 - Recent weight loss of three pounds since starting Ozempic  - Abdominal ultrasound revealed fatty liver disease  Hypertension management - Hypertension treated with valsartan -hydrochlorothiazide  320-25 mg daily - Current blood pressure is 120/68 mmHg  Polycystic ovary syndrome (pcos) - History of PCOS  Mental health symptoms - History of anxiety, depression, and PTSD attributed to loss of her father in 2022 due to complications from open heart surgery - Currently taking Zoloft  25 mg daily and hydroxyzine  25 mg for anxiety - Mental health has been stable recently - Receives significant emotional support from her dog  Preventive health screening - No history of Pap smear    Flowsheet Row Office Visit from 05/03/2024 in [facility]  1 57 inches   Body mass index is 64.22 kg/m.  Wt Readings from Last 3 Encounters:  05/03/24 (!) 410 lb (186 kg)  03/12/24 (!) 413 lb (187.3 kg)  01/10/24 (!) 413 lb (187.3 kg)        05/03/2024   10:35 AM 03/12/2024   11:02 AM 01/03/2024    2:08 PM  Depression screen PHQ 2/9  Decreased Interest 0 1 0  Down, Depressed, Hopeless 1 0 0  PHQ - 2 Score 1 1 0  Altered sleeping 3 3   Tired, decreased energy 2 3   Change in appetite 1 3   Feeling bad or failure about yourself  0 0   Trouble concentrating 0 1   Moving slowly or fidgety/restless 0 0   Suicidal thoughts 0 0   PHQ-9 Score 7 11    Difficult doing work/chores Somewhat difficult Not difficult at all      Data saved with a previous flowsheet row definition       05/03/2024   10:36 AM 03/12/2024   11:03 AM 12/18/2023    2:33 PM 11/09/2023    4:10 PM  GAD 7 : Generalized Anxiety Score  Nervous, Anxious, on Edge 1 2    Control/stop worrying 0 2    Worry too much - different things 3 2    Trouble relaxing 3 2    Restless 0 0    Easily annoyed or  irritable 1 1    Afraid - awful might happen 0 0    Total GAD 7 Score 8 9    Anxiety Difficulty Somewhat difficult Somewhat difficult       Information is confidential and restricted. Go to Review Flowsheets to unlock data.     Relevant past medical, surgical, family and social history reviewed and updated as indicated. Interim medical history since our last visit reviewed. Allergies and medications reviewed and updated.  Review of Systems  Constitutional: Negative for fever or weight change.  Respiratory: Negative for cough and shortness of breath.   Cardiovascular: Negative for chest pain or palpitations.  Gastrointestinal: Negative for abdominal pain, no bowel changes.  Musculoskeletal: Negative for gait problem or joint swelling.  Skin: Negative for rash.  Neurological: Negative for dizziness or headache.  No other specific complaints  in a complete review of systems (except as listed in HPI above).      Objective:      BP 120/68   Pulse 89   Temp 98.4 F (36.9 C)   Ht 5' 7 (1.702 m)   Wt (!) 410 lb (186 kg)   LMP 05/03/2024   SpO2 97%   BMI 64.22 kg/m    Wt Readings from Last 3 Encounters:  05/03/24 (!) 410 lb (186 kg)  03/12/24 (!) 413 lb (187.3 kg)  01/10/24 (!) 413 lb (187.3 kg)    Physical Exam VITALS: BP- 120/68 MEASUREMENTS: Weight- 410, BMI- 64.2. GENERAL: Alert, cooperative, well developed, no acute distress HEENT: Normocephalic, normal oropharynx, moist mucous membranes CHEST: Clear to auscultation bilaterally, no wheezes, rhonchi, or crackles CARDIOVASCULAR: Normal heart rate and rhythm, S1 and S2 normal without murmurs ABDOMEN: Soft, non-tender, non-distended, without organomegaly, normal bowel sounds EXTREMITIES: No cyanosis or edema NEUROLOGICAL: Cranial nerves grossly intact, moves all extremities without gross motor or sensory deficit  Results for orders placed or performed in visit on 03/12/24  CBC with Differential/Platelet   Collection Time: 03/12/24 11:17 AM  Result Value Ref Range   WBC 11.3 (H) 4.0 - 10.5 K/uL   RBC 4.89 3.87 - 5.11 Mil/uL   Hemoglobin 11.5 (L) 12.0 - 15.0 g/dL   HCT 63.1 63.9 - 53.9 %   MCV 75.3 (L) 78.0 - 100.0 fl   MCHC 31.2 30.0 - 36.0 g/dL   RDW 83.3 (H) 88.4 - 84.4 %   Platelets 438.0 (H) 150.0 - 400.0 K/uL   Neutrophils Relative % 67.7 43.0 - 77.0 %   Lymphocytes Relative 24.5 12.0 - 46.0 %   Monocytes Relative 5.3 3.0 - 12.0 %   Eosinophils Relative 2.2 0.0 - 5.0 %   Basophils Relative 0.3 0.0 - 3.0 %   Neutro Abs 7.7 1.4 - 7.7 K/uL   Lymphs Abs 2.8 0.7 - 4.0 K/uL   Monocytes Absolute 0.6 0.1 - 1.0 K/uL   Eosinophils Absolute 0.3 0.0 - 0.7 K/uL   Basophils Absolute 0.0 0.0 - 0.1 K/uL  Comp Met (CMET)   Collection Time: 03/12/24 11:17 AM  Result Value Ref Range   Sodium 139 135 - 145 mEq/L   Potassium 4.4 3.5 - 5.1 mEq/L   Chloride 107 96 -  112 mEq/L   CO2 26 19 - 32 mEq/L   Glucose, Bld 105 (H) 70 - 99 mg/dL   BUN 9 6 - 23 mg/dL   Creatinine, Ser 9.33 0.40 - 1.20 mg/dL   Total Bilirubin 0.3 0.2 - 1.2 mg/dL   Alkaline Phosphatase 61 39 - 117 U/L  AST 13 0 - 37 U/L   ALT 16 0 - 35 U/L   Total Protein 7.5 6.0 - 8.3 g/dL   Albumin 4.0 3.5 - 5.2 g/dL   GFR 878.53 >39.99 mL/min   Calcium 9.1 8.4 - 10.5 mg/dL  Hemoglobin J8r   Collection Time: 03/12/24 11:17 AM  Result Value Ref Range   Hgb A1c MFr Bld 7.3 (H) 4.6 - 6.5 %          Assessment & Plan:   Problem List Items Addressed This Visit       Cardiovascular and Mediastinum   HTN (hypertension) - Primary     Endocrine   PCOS (polycystic ovarian syndrome)   Type 2 diabetes mellitus with hyperglycemia, without long-term current use of insulin (HCC)   Relevant Medications   ondansetron  (ZOFRAN -ODT) 4 MG disintegrating tablet     Other   Morbid obesity (HCC)   Insomnia   Generalized anxiety disorder   PTSD (post-traumatic stress disorder)   MDD (major depressive disorder), recurrent, in full remission     Assessment and Plan Assessment & Plan Type 2 diabetes mellitus with hyperglycemia Type 2 diabetes with recent A1c of 7.3, indicating suboptimal control. Currently on metformin  and Ozempic . Reports nausea as a side effect of Ozempic , with one episode of vomiting. No insulin use. Recent weight loss of 3 pounds noted. - Prescribed Zofran  for nausea associated with Ozempic . - Continue metformin  500 mg twice daily and Ozempic  1 mg weekly. - Will schedule follow-up in 3 months to reassess diabetes management and A1c.  Morbid obesity BMI of 64.2. Recent weight loss of 3 pounds noted with Ozempic  use. - Continue current diabetes management plan as it may aid in weight loss. - Encourage continuation of lifestyle modifications, including dietary management and regular exercise. -continue to increase physical activity, getting at least 150 min of physical  activity a week.  Work on including runner, broadcasting/film/video 2 days a week.  - continue eating at a calorie deficit 1600-1700 cal a day, eating a well balanced diet with whole foods, avoiding processed foods.   Patient is motivated to continue working on lifestyle modification.    Essential hypertension Blood pressure reading of 120/68 mmHg. - Continue valsartan -hydrochlorothiazide  320-25 mg daily.  Fatty liver disease Diagnosed via recent abdominal ultrasound.  Generalized anxiety disorder, depression, and post-traumatic stress disorder Generalized anxiety disorder, depression, and PTSD. Currently managed with Zoloft  and hydroxyzine . Reports improvement in mental health with emotional support from a pet. Recent stressors include provider changes and loss of a support system. - Continue Zoloft  25 mg daily and hydroxyzine  25 mg as needed for anxiety. - Ensure continuity of care with psychiatrist, especially during provider transition.  General Health Maintenance Needs initial Pap smear as she has never had one. Requires microalbumin urine test, foot exam, and diabetic eye exam as part of diabetes management. - Will schedule Pap smear. - Will order microalbumin urine test at next visit. - Will schedule annual foot exam. - Will refer for diabetic eye exam.        Follow up plan: Return in about 3 months (around 08/03/2024) for follow up.

## 2024-06-16 ENCOUNTER — Other Ambulatory Visit: Payer: Self-pay | Admitting: Certified Nurse Midwife

## 2024-06-16 DIAGNOSIS — N946 Dysmenorrhea, unspecified: Secondary | ICD-10-CM

## 2024-06-16 DIAGNOSIS — N926 Irregular menstruation, unspecified: Secondary | ICD-10-CM

## 2024-06-16 DIAGNOSIS — E282 Polycystic ovarian syndrome: Secondary | ICD-10-CM

## 2024-07-01 ENCOUNTER — Encounter: Payer: Self-pay | Admitting: Nurse Practitioner

## 2024-07-01 MED ORDER — SEMAGLUTIDE (1 MG/DOSE) 4 MG/3ML ~~LOC~~ SOPN: 1.0000 mg | PEN_INJECTOR | SUBCUTANEOUS | 0 refills | Status: AC

## 2024-08-09 ENCOUNTER — Ambulatory Visit: Admitting: Family Medicine
